# Patient Record
Sex: Female | Born: 1975 | Race: White | Hispanic: No | Marital: Married | Smoking: Never smoker
Health system: Southern US, Community
[De-identification: ages and names within clinical notes are randomized; demographics above are authoritative.]

## PROBLEM LIST (undated history)

## (undated) DIAGNOSIS — N979 Female infertility, unspecified: Secondary | ICD-10-CM

## (undated) HISTORY — DX: Female infertility, unspecified: N97.9

---

## 2000-08-09 ENCOUNTER — Other Ambulatory Visit: Admission: RE | Admit: 2000-08-09 | Discharge: 2000-08-09 | Payer: Self-pay | Admitting: Obstetrics and Gynecology

## 2000-09-05 ENCOUNTER — Encounter: Payer: Self-pay | Admitting: Obstetrics and Gynecology

## 2000-09-05 ENCOUNTER — Ambulatory Visit (HOSPITAL_COMMUNITY): Admission: RE | Admit: 2000-09-05 | Discharge: 2000-09-05 | Payer: Self-pay | Admitting: Obstetrics and Gynecology

## 2001-08-20 ENCOUNTER — Other Ambulatory Visit: Admission: RE | Admit: 2001-08-20 | Discharge: 2001-08-20 | Payer: Self-pay | Admitting: Gynecology

## 2002-03-19 HISTORY — PX: COLON SURGERY: SHX602

## 2002-10-20 ENCOUNTER — Encounter: Payer: Self-pay | Admitting: Obstetrics and Gynecology

## 2002-10-20 ENCOUNTER — Ambulatory Visit (HOSPITAL_COMMUNITY): Admission: RE | Admit: 2002-10-20 | Discharge: 2002-10-20 | Payer: Self-pay | Admitting: Obstetrics & Gynecology

## 2002-12-19 ENCOUNTER — Inpatient Hospital Stay (HOSPITAL_COMMUNITY): Admission: AD | Admit: 2002-12-19 | Discharge: 2002-12-25 | Payer: Self-pay | Admitting: Obstetrics and Gynecology

## 2002-12-19 ENCOUNTER — Encounter: Payer: Self-pay | Admitting: Obstetrics and Gynecology

## 2004-04-13 ENCOUNTER — Other Ambulatory Visit: Admission: RE | Admit: 2004-04-13 | Discharge: 2004-04-13 | Payer: Self-pay | Admitting: Obstetrics and Gynecology

## 2004-10-12 ENCOUNTER — Other Ambulatory Visit: Admission: RE | Admit: 2004-10-12 | Discharge: 2004-10-12 | Payer: Self-pay | Admitting: Obstetrics and Gynecology

## 2010-07-21 ENCOUNTER — Other Ambulatory Visit: Payer: Self-pay | Admitting: Obstetrics and Gynecology

## 2010-11-23 ENCOUNTER — Other Ambulatory Visit: Payer: Self-pay | Admitting: Obstetrics and Gynecology

## 2012-12-08 ENCOUNTER — Encounter: Payer: Self-pay | Admitting: Obstetrics and Gynecology

## 2012-12-08 ENCOUNTER — Ambulatory Visit (INDEPENDENT_AMBULATORY_CARE_PROVIDER_SITE_OTHER): Payer: BC Managed Care – PPO | Admitting: Obstetrics and Gynecology

## 2012-12-08 VITALS — BP 130/76 | HR 76 | Ht 65.0 in | Wt 188.0 lb

## 2012-12-08 DIAGNOSIS — Z01419 Encounter for gynecological examination (general) (routine) without abnormal findings: Secondary | ICD-10-CM

## 2012-12-08 DIAGNOSIS — Z0000 Encounter for general adult medical examination without abnormal findings: Secondary | ICD-10-CM

## 2012-12-08 DIAGNOSIS — N912 Amenorrhea, unspecified: Secondary | ICD-10-CM

## 2012-12-08 LAB — POCT URINALYSIS DIPSTICK
Bilirubin, UA: NEGATIVE
Glucose, UA: NEGATIVE
Ketones, UA: NEGATIVE
Nitrite, UA: NEGATIVE
Protein, UA: NEGATIVE
Urobilinogen, UA: NEGATIVE
pH, UA: 5

## 2012-12-08 LAB — CBC
MCH: 29.8 pg (ref 26.0–34.0)
MCHC: 33.8 g/dL (ref 30.0–36.0)
Platelets: 389 10*3/uL (ref 150–400)
RBC: 4.53 MIL/uL (ref 3.87–5.11)
WBC: 10.7 10*3/uL — ABNORMAL HIGH (ref 4.0–10.5)

## 2012-12-08 LAB — POCT URINE PREGNANCY: Preg Test, Ur: NEGATIVE

## 2012-12-08 NOTE — Progress Notes (Signed)
Patient ID: Desiree Melendez, female   DOB: 07-12-1975, 37 y.o.   MRN: [id number] GYNECOLOGY VISIT  PCP: None  Referring provider:   HPI: 37 y.o.   Married  Caucasian  female   G2P0202 with Patient's last menstrual period was 09/17/2012.   here for   AEX. Negative UPT. Felt crampy this weekend but menses did not start.   Hgb:  13.7 Urine:  1+WBC's, 1+ RBC's (asymptomatic). UPT - negative  GYNECOLOGIC HISTORY: Patient's last menstrual period was 09/17/2012. Sexually active:  yes Partner preference: female Contraception:  none  Menopausal hormone therapy: no DES exposure:   no Blood transfusions:  no  Sexually transmitted diseases:  no  GYN Procedures:  IVP treatment for infertility Mammogram:  n/a               Pap: 2012 wnl History of abnormal pap smear:  no   OB History   Grav Para Term Preterm Abortions TAB SAB Ect Mult Living   2 2  2     1 2        LIFESTYLE: Exercise:   Tennis, walking            Tobacco:    no Alcohol:       no Drug use:    no  OTHER HEALTH MAINTENANCE: Tetanus/TDap:  Up to date through school Gardisil:  NA Influenza:  11/2011 Zostavax:  NA  Bone density:n/a Colonoscopy:n/a  Cholesterol check: never  Family History  Problem Relation Age of Onset  . Osteoarthritis Mother   . Hypertension Mother   . Asthma Father   . Thyroid disease Sister   . Cancer Maternal Grandmother   . Hypertension Maternal Grandmother   . Stroke Maternal Grandmother     There are no active problems to display for this patient.  Past Medical History  Diagnosis Date  . Infertility, female     Past Surgical History  Procedure Laterality Date  . Colon surgery  2004    ALLERGIES: Review of patient's allergies indicates no known allergies.  No current outpatient prescriptions on file.   No current facility-administered medications for this visit.     ROS:  Pertinent items are noted in HPI.  SOCIAL HISTORY:   Married.  Teacher.   PHYSICAL  EXAMINATION:    BP 130/76  Pulse 76  Ht 5\' 5"  (1.651 m)  Wt 188 lb (85.276 kg)  BMI 31.28 kg/m2  LMP 09/17/2012   Wt Readings from Last 3 Encounters:  12/08/12 188 lb (85.276 kg)     Ht Readings from Last 3 Encounters:  12/08/12 5\' 5"  (1.651 m)    General appearance: alert, cooperative and appears stated age Head: Normocephalic, without obvious abnormality, atraumatic Neck: no adenopathy, supple, symmetrical, trachea midline and thyroid not enlarged, symmetric, no tenderness/mass/nodules Lungs: clear to auscultation bilaterally Breasts: Inspection negative, No nipple retraction or dimpling, No nipple discharge or bleeding, No axillary or supraclavicular adenopathy, Normal to palpation without dominant masses Heart: regular rate and rhythm Abdomen: Pfannenstiel incision, soft, non-tender; no masses,  no organomegaly Extremities: extremities normal, atraumatic, no cyanosis or edema Skin: Skin color, texture, turgor normal. No rashes or lesions Lymph nodes: Cervical, supraclavicular, and axillary nodes normal. No abnormal inguinal nodes palpated Neurologic: Grossly normal  Pelvic: External genitalia:  no lesions              Urethra:  normal appearing urethra with no masses, tenderness or lesions  Bartholins and Skenes: normal                 Vagina: normal appearing vagina with normal color and discharge, no lesions              Cervix: normal appearance              Pap and high risk HPV testing done: yes.            Bimanual Exam:  Uterus:  uterus is normal size, shape, consistency and nontender                                      Adnexa: normal adnexa in size, nontender and no masses                                      Rectovaginal: Confirms                                      Anus:  normal sphincter tone, no lesions  ASSESSMENT  Oligomenorrhea. Possible UTI.  PLAN  Mammogram Pap smear and high risk HPV testing Lipid profile, CMP, CBC. TSH, prolactin,  LH, FSH, estradiol. Urine culture.  Anticipate a course of Provera therapy.  Will await results of blood work.    Return annually or prn   An After Visit Summary was printed and given to the patient.

## 2012-12-08 NOTE — Patient Instructions (Signed)
EXERCISE AND DIET:  We recommended that you start or continue a regular exercise program for good health. Regular exercise means any activity that makes your heart beat faster and makes you sweat.  We recommend exercising at least 30 minutes per day at least 3 days a week, preferably 4 or 5.  We also recommend a diet low in fat and sugar.  Inactivity, poor dietary choices and obesity can cause diabetes, heart attack, stroke, and kidney damage, among others.    ALCOHOL AND SMOKING:  Women should limit their alcohol intake to no more than 7 drinks/beers/glasses of wine (combined, not each!) per week. Moderation of alcohol intake to this level decreases your risk of breast cancer and liver damage. And of course, no recreational drugs are part of a healthy lifestyle.  And absolutely no smoking or even second hand smoke. Most people know smoking can cause heart and lung diseases, but did you know it also contributes to weakening of your bones? Aging of your skin?  Yellowing of your teeth and nails?  CALCIUM AND VITAMIN D:  Adequate intake of calcium and Vitamin D are recommended.  The recommendations for exact amounts of these supplements seem to change often, but generally speaking 600 mg of calcium (either carbonate or citrate) and 800 units of Vitamin D per day seems prudent. Certain women may benefit from higher intake of Vitamin D.  If you are among these women, your doctor will have told you during your visit.    PAP SMEARS:  Pap smears, to check for cervical cancer or precancers,  have traditionally been done yearly, although recent scientific advances have shown that most women can have pap smears less often.  However, every woman still should have a physical exam from her gynecologist every year. It will include a breast check, inspection of the vulva and vagina to check for abnormal growths or skin changes, a visual exam of the cervix, and then an exam to evaluate the size and shape of the uterus and  ovaries.  And after 38 years of age, a rectal exam is indicated to check for rectal cancers. We will also provide age appropriate advice regarding health maintenance, like when you should have certain vaccines, screening for sexually transmitted diseases, bone density testing, colonoscopy, mammograms, etc.   MAMMOGRAMS:  All women over 40 years old should have a yearly mammogram. Many facilities now offer a "3D" mammogram, which may cost around $50 extra out of pocket. If possible,  we recommend you accept the option to have the 3D mammogram performed.  It both reduces the number of women who will be called back for extra views which then turn out to be normal, and it is better than the routine mammogram at detecting truly abnormal areas.    COLONOSCOPY:  Colonoscopy to screen for colon cancer is recommended for all women at age 50.  We know, you hate the idea of the prep.  We agree, BUT, having colon cancer and not knowing it is worse!!  Colon cancer so often starts as a polyp that can be seen and removed at colonscopy, which can quite literally save your life!  And if your first colonoscopy is normal and you have no family history of colon cancer, most women don't have to have it again for 10 years.  Once every ten years, you can do something that may end up saving your life, right?  We will be happy to help you get it scheduled when you are ready.    Be sure to check your insurance coverage so you understand how much it will cost.  It may be covered as a preventative service at no cost, but you should check your particular policy.    Secondary Amenorrhea  Secondary amenorrhea is the stopping of menstrual flow for 3 to 6 months in a female who has previously had periods. There are many possible causes. Most of these causes are not serious. Usually treating the underlying problem causing the loss of menses will return your periods to normal. CAUSES  Some common and uncommon causes of not menstruating  include:  Malnutrition.  Low blood sugar (hypoglycemia).  Polycystic ovarian disease.  Stress or fear.  Breastfeeding.  Hormone imbalance.  Ovarian failure.  Medications.  Extreme obesity.  Cystic fibrosis.  Low body weight or drastic weight reduction from any cause.  Early menopause.  Removal of ovaries or uterus.  Contraceptives.  Illness.  Long term (chronic) illnesses.  Cushing's syndrome.  Thyroid problems.  Birth control pills, patches, or vaginal rings for birth control. DIAGNOSIS  This diagnosis is made by your caregiver taking a medical history and doing a physical exam. Pregnancy must be ruled out. Often times, numerous blood tests of different hormones in the body may be measured. Urine testing may be done. Specialized x-rays may have to be done as well as measuring the body mass index (BMI). TREATMENT  Treatment depends on the cause of the amenorrhea. If an eating disorder is present, this can be treated with an adequate diet and therapy. Chronic illnesses may improve with treatment of the illness. Overall, the outlook is good. The amenorrhea may be corrected with medications, lifestyle changes, or surgery. If the amenorrhea cannot be corrected, it is sometimes possible to create a false menstruation with medications. Document Released: 04/16/2006 Document Revised: 05/28/2011 Document Reviewed: 02/21/2007 [facility] Patient Information 2014 [place], [place].

## 2012-12-09 LAB — LIPID PANEL
Cholesterol: 142 mg/dL (ref 0–200)
VLDL: 13 mg/dL (ref 0–40)

## 2012-12-09 LAB — THYROID PANEL WITH TSH
Free Thyroxine Index: 3.3 (ref 1.0–3.9)
T3 Uptake: 38 % — ABNORMAL HIGH (ref 22.5–37.0)

## 2012-12-09 LAB — HEMOGLOBIN, FINGERSTICK: Hemoglobin, fingerstick: 13.7 g/dL (ref 12.0–16.0)

## 2012-12-09 LAB — COMPREHENSIVE METABOLIC PANEL
ALT: 9 U/L (ref 0–35)
AST: 15 U/L (ref 0–37)
CO2: 26 mEq/L (ref 19–32)
Chloride: 105 mEq/L (ref 96–112)
Creat: 0.86 mg/dL (ref 0.50–1.10)
Sodium: 139 mEq/L (ref 135–145)
Total Bilirubin: 0.4 mg/dL (ref 0.3–1.2)
Total Protein: 7.5 g/dL (ref 6.0–8.3)

## 2012-12-09 LAB — FSH/LH: FSH: 9.1 m[IU]/mL

## 2012-12-09 LAB — PROLACTIN: Prolactin: 32.9 ng/mL

## 2012-12-10 ENCOUNTER — Other Ambulatory Visit: Payer: Self-pay | Admitting: Obstetrics and Gynecology

## 2012-12-10 ENCOUNTER — Telehealth: Payer: Self-pay | Admitting: Obstetrics and Gynecology

## 2012-12-10 DIAGNOSIS — N912 Amenorrhea, unspecified: Secondary | ICD-10-CM

## 2012-12-10 MED ORDER — MEDROXYPROGESTERONE ACETATE 10 MG PO TABS: 10.0000 mg | ORAL_TABLET | Freq: Every day | ORAL | Status: DC

## 2012-12-10 NOTE — Telephone Encounter (Signed)
Unable to reach the patient by phone.  Connection lost.  I left a message that I will release results in My Chart. Patient will need a follow up prolactin in one month and will need to take an Rx for Provera for 10 days.

## 2012-12-11 ENCOUNTER — Encounter: Payer: Self-pay | Admitting: Obstetrics and Gynecology

## 2012-12-11 LAB — IPS PAP TEST WITH HPV

## 2012-12-17 ENCOUNTER — Telehealth: Payer: Self-pay

## 2012-12-17 NOTE — Telephone Encounter (Signed)
Dr. Edward Jolly, in reviewing chart, I noticed a urine culture was never sent on patient for office visit 12-08-12.  Patient was asymptomatic.  Please Advise.

## 2012-12-17 NOTE — Telephone Encounter (Signed)
Please contact patient and have her come for a lab visit only for urine dip and if positive urinalysis and culture.    Thanks.

## 2012-12-17 NOTE — Telephone Encounter (Signed)
LMOVM to call.  Pt. Seen 9-22 and had 1+WBC's  And 1+RBC's on urine dip but a urine culture was not sent.  Dr. Edward Jolly would Like patient to come by office for urine dip and if positive a urinalysis and culture.

## 2012-12-18 NOTE — Telephone Encounter (Signed)
Spoke with patient, appt scheduled for U/A check 12/19/12 if Positive for RBC's or WBC's send for Culture Per Dr. Edward Jolly.

## 2012-12-19 ENCOUNTER — Ambulatory Visit (INDEPENDENT_AMBULATORY_CARE_PROVIDER_SITE_OTHER): Payer: BC Managed Care – PPO | Admitting: Obstetrics and Gynecology

## 2012-12-19 VITALS — Ht 65.0 in

## 2012-12-19 DIAGNOSIS — R319 Hematuria, unspecified: Secondary | ICD-10-CM

## 2012-12-19 DIAGNOSIS — R8281 Pyuria: Secondary | ICD-10-CM

## 2012-12-19 DIAGNOSIS — R82998 Other abnormal findings in urine: Secondary | ICD-10-CM

## 2012-12-19 NOTE — Progress Notes (Signed)
Patient is on cycle Per Dr. Edward Jolly she prefers her not to be on cycle as far as doing urinalysis. Patient decided that she will come back next week.

## 2012-12-20 NOTE — Progress Notes (Signed)
Patient previously presented for annual exam and had some RBCs and WBCs noted and was not symptomatic. Was expected her menses which came some days later. Urine culture was not sent at the occasion so the patient was asked to return to give another specimen. Patient is still on her menstrual cycle, so was asked to return.  Patient seen by nurse and is agreeable to the plan.

## 2012-12-23 ENCOUNTER — Other Ambulatory Visit: Payer: BC Managed Care – PPO

## 2012-12-24 ENCOUNTER — Other Ambulatory Visit: Payer: BC Managed Care – PPO

## 2013-01-22 ENCOUNTER — Other Ambulatory Visit: Payer: Self-pay

## 2013-12-18 ENCOUNTER — Telehealth: Payer: Self-pay

## 2013-12-18 NOTE — Telephone Encounter (Signed)
Spoke with patient. Patient is agreeable to schedule appointment at this time. Offered Oct 7th at 11am but patient declines due to meeting at work. Appointment scheduled for Oct 21st at 3pm with Dr.Silva. Patient is agreeable to date and time.  Routing to provider for final review. Patient agreeable to disposition. Will close encounter

## 2013-12-18 NOTE — Telephone Encounter (Signed)
Message copied by Jasmine Awe on Fri Dec 18, 2013 10:42 AM ------      Message from: [place], BROOK E      Created: Fri Dec 18, 2013  6:35 AM      Regarding: Please schedule AEX and labs       Please contact patient to have her come in for her annual exam and repeat prolactin level.       It was slightly elevated at her last year's annual exam, and she has not returned for a recheck.       I received a reminder message for labs past due.             Thanks!            Josefa Half      ----- Message -----         From: SYSTEM         Sent: 12/15/2013  12:01 AM           To: Brook E Amundson de Berton Lan, MD                   ------

## 2014-01-01 ENCOUNTER — Other Ambulatory Visit: Payer: Self-pay

## 2014-01-06 ENCOUNTER — Ambulatory Visit: Payer: BC Managed Care – PPO | Admitting: Obstetrics and Gynecology

## 2014-01-18 ENCOUNTER — Encounter: Payer: Self-pay | Admitting: Obstetrics and Gynecology

## 2014-02-10 ENCOUNTER — Ambulatory Visit (INDEPENDENT_AMBULATORY_CARE_PROVIDER_SITE_OTHER): Payer: BC Managed Care – PPO | Admitting: Certified Nurse Midwife

## 2014-02-10 ENCOUNTER — Encounter: Payer: Self-pay | Admitting: Certified Nurse Midwife

## 2014-02-10 VITALS — BP 110/64 | HR 64 | Resp 16 | Ht 65.25 in | Wt 190.0 lb

## 2014-02-10 DIAGNOSIS — R899 Unspecified abnormal finding in specimens from other organs, systems and tissues: Secondary | ICD-10-CM

## 2014-02-10 DIAGNOSIS — Z01419 Encounter for gynecological examination (general) (routine) without abnormal findings: Secondary | ICD-10-CM

## 2014-02-10 DIAGNOSIS — N912 Amenorrhea, unspecified: Secondary | ICD-10-CM

## 2014-02-10 NOTE — Patient Instructions (Signed)

## 2014-02-10 NOTE — Progress Notes (Signed)
38 y.o. G2P0202 Married Caucasian Fe here for annual exam. Periods normal, no issues. Contraception condoms, working well. Sees [facility] if needed. No health issues today. Due for Prolactin repeat due to slight elevation.  Patient's last menstrual period was 01/02/2014.          Sexually active: Yes.    The current method of family planning is condoms sometimes.    Exercising: Yes.    cardio Smoker:  no  Health Maintenance: Pap:  12-08-12 neg HPV HR neg, endo cells noted MMG:  none Colonoscopy:  none BMD:   none TDaP:  2005 Labs: Upt-neg Self breast exam: done monthly   reports that she has never smoked. She does not have any smokeless tobacco history on file. She reports that she does not drink alcohol or use illicit drugs.  Past Medical History  Diagnosis Date  . Infertility, female     Past Surgical History  Procedure Laterality Date  . Colon surgery  2004    No current outpatient prescriptions on file.   No current facility-administered medications for this visit.    Family History  Problem Relation Age of Onset  . Osteoarthritis Mother   . Hypertension Mother   . Asthma Father   . Thyroid disease Sister   . Cancer Maternal Grandmother   . Hypertension Maternal Grandmother   . Stroke Maternal Grandmother     ROS:  Pertinent items are noted in HPI.  Otherwise, a comprehensive ROS was negative.  Exam:   BP 110/64 mmHg  Pulse 64  Resp 16  Ht 5' 5.25" (1.657 m)  Wt 190 lb (86.183 kg)  BMI 31.39 kg/m2  LMP 01/02/2014 Height: 5' 5.25" (165.7 cm)  Ht Readings from Last 3 Encounters:  02/10/14 5' 5.25" (1.657 m)  12/19/12 5\' 5"  (1.651 m)  12/08/12 5\' 5"  (1.651 m)    General appearance: alert, cooperative and appears stated age Head: Normocephalic, without obvious abnormality, atraumatic Neck: no adenopathy, supple, symmetrical, trachea midline and thyroid normal to inspection and palpation and non-palpable Lungs: clear to auscultation  bilaterally Breasts: normal appearance, no masses or tenderness, Inspection negative, No nipple retraction or dimpling Heart: regular rate and rhythm Abdomen: soft, non-tender; no masses,  no organomegaly Extremities: extremities normal, atraumatic, no cyanosis or edema Skin: Skin color, texture, turgor normal. No rashes or lesions Lymph nodes: Cervical, supraclavicular, and axillary nodes normal. No abnormal inguinal nodes palpated Neurologic: Grossly normal   Pelvic: External genitalia:  no lesions              Urethra:  normal appearing urethra with no masses, tenderness or lesions              Bartholin's and Skene's: normal                 Vagina: normal appearing vagina with normal color and discharge, no lesions              Cervix: normal, non tender, no masses              Pap taken: No. Bimanual Exam:  Uterus:  normal size, contour, position, consistency, mobility, non-tender and mid position              Adnexa: normal adnexa and no mass, fullness, tenderness               Rectovaginal: Confirms               Anus:  normal sphincter tone,  no lesions  A:  Well Woman with normal exam  Contraception condoms  Previous elevated Prolactin level, for repeat today  P:   Reviewed health and wellness pertinent to exam  Lab: Prolactin  Pap smear not taken today   counseled on breast self exam, adequate intake of calcium and vitamin D, diet and exercise  return annually or prn  An After Visit Summary was printed and given to the patient.

## 2014-02-11 LAB — PROLACTIN: Prolactin: 11.4 ng/mL

## 2014-02-13 NOTE — Progress Notes (Signed)
Reviewed personally.  M. Suzanne Ieesha Abbasi, MD.  

## 2015-02-14 ENCOUNTER — Telehealth: Payer: Self-pay | Admitting: Certified Nurse Midwife

## 2015-02-14 NOTE — Telephone Encounter (Signed)
Left message regarding upcoming appointment has been canceled and needs to be rescheduled. °

## 2015-02-17 ENCOUNTER — Ambulatory Visit: Payer: BC Managed Care – PPO | Admitting: Certified Nurse Midwife

## 2015-03-09 ENCOUNTER — Ambulatory Visit: Payer: BC Managed Care – PPO | Admitting: Certified Nurse Midwife

## 2015-03-24 ENCOUNTER — Ambulatory Visit: Payer: BC Managed Care – PPO | Admitting: Certified Nurse Midwife

## 2018-09-29 ENCOUNTER — Other Ambulatory Visit: Payer: Self-pay

## 2018-10-01 ENCOUNTER — Encounter: Payer: Self-pay | Admitting: Obstetrics and Gynecology

## 2018-10-01 ENCOUNTER — Other Ambulatory Visit: Payer: Self-pay

## 2018-10-01 ENCOUNTER — Other Ambulatory Visit (HOSPITAL_COMMUNITY)
Admission: RE | Admit: 2018-10-01 | Discharge: 2018-10-01 | Disposition: A | Discharge status: Home / Self Care | Payer: BC Managed Care – PPO | Source: Ambulatory Visit | Attending: Obstetrics and Gynecology | Admitting: Obstetrics and Gynecology

## 2018-10-01 ENCOUNTER — Ambulatory Visit: Payer: BC Managed Care – PPO | Admitting: Obstetrics and Gynecology

## 2018-10-01 VITALS — BP 122/80 | HR 72 | Temp 97.4°F | Resp 12 | Ht 65.5 in | Wt 194.0 lb

## 2018-10-01 DIAGNOSIS — Z01419 Encounter for gynecological examination (general) (routine) without abnormal findings: Secondary | ICD-10-CM

## 2018-10-01 DIAGNOSIS — Z23 Encounter for immunization: Secondary | ICD-10-CM | POA: Diagnosis not present

## 2018-10-01 DIAGNOSIS — E229 Hyperfunction of pituitary gland, unspecified: Secondary | ICD-10-CM

## 2018-10-01 DIAGNOSIS — R7989 Other specified abnormal findings of blood chemistry: Secondary | ICD-10-CM

## 2018-10-01 NOTE — Patient Instructions (Signed)

## 2018-10-01 NOTE — Progress Notes (Signed)
43 y.o. G7P0002 Married Caucasian female here as an old-new for an annual exam. Patient states that she has had two yeast infections in one month.     4 menses in the last 2 years.  Nov 2018, May 2019, Nov 2019, late April 2020.  States she had irregular cycles prior to this. Denies hot flashes.  Told her FSH was low, about 12.3.   She is not certain about her estrogen level.  She had low testosterone and is doing pellets to raise her level. States her thyroid level was normal.  States the remainder of there labs were normal.   Did labs through [facility].   Patient is a Pharmacist, hospital.  Children are 69 yo.  PCP: No PCP - patient however has been seen at [facility] for weight loss and has had blood work    Patient's last menstrual period was 07/08/2018 (within days).     Period Cycle (Days): (every 6 months) Period Duration (Days): 3-6 Period Pattern: (!) Irregular Menstrual Flow: Moderate Menstrual Control: Maxi pad Menstrual Control Change Freq (Hours): 3-4 Dysmenorrhea: None     Sexually active: Yes.    The current method of family planning is none.    Exercising: Yes.    cycling and walking Smoker:  no  Health Maintenance: Pap:  12/08/12 Neg:Neg HR HPV History of abnormal Pap:  no MMG:  never TDaP:  2010 -- patient is due Gardasil:   no HIV: negative in pregnancy Screening Labs: had labs done recently with [facility]   reports that she has never smoked. She has never used smokeless tobacco. She reports that she does not drink alcohol or use drugs.  Past Medical History:  Diagnosis Date  . Infertility, female     Past Surgical History:  Procedure Laterality Date  . CESAREAN SECTION      No current outpatient medications on file.   No current facility-administered medications for this visit.     Family History  Problem Relation Age of Onset  . Osteoarthritis Mother   . Hypertension Mother   . Asthma Father   . Thyroid disease Sister   . Cancer  Maternal Grandmother   . Hypertension Maternal Grandmother   . Stroke Maternal Grandmother     Review of Systems  Constitutional: Negative.   HENT: Negative.   Eyes: Negative.   Respiratory: Negative.   Cardiovascular: Negative.   Gastrointestinal: Negative.   Endocrine: Negative.   Genitourinary: Negative.   Musculoskeletal: Negative.   Skin: Negative.   Allergic/Immunologic: Negative.   Neurological: Negative.   Hematological: Negative.   Psychiatric/Behavioral: Negative.     Exam:   BP 122/80 (BP Location: Right Arm, Patient Position: Sitting, Cuff Size: Normal)   Pulse 72   Temp (!) 97.4 F (36.3 C) (Temporal)   Resp 12   Ht 5' 5.5" (1.664 m)   Wt 194 lb (88 kg)   LMP 07/08/2018 (Within Days)   BMI 31.79 kg/m     General appearance: alert, cooperative and appears stated age Head: normocephalic, without obvious abnormality, atraumatic Neck: no adenopathy, supple, symmetrical, trachea midline and thyroid normal to inspection and palpation Lungs: clear to auscultation bilaterally Breasts: normal appearance, no masses or tenderness, No nipple retraction or dimpling, No nipple discharge or bleeding, No axillary adenopathy Heart: regular rate and rhythm Abdomen: soft, non-tender; no masses, no organomegaly Extremities: extremities normal, atraumatic, no cyanosis or edema Skin: skin color, texture, turgor normal. No rashes or lesions Lymph nodes: cervical, supraclavicular,  and axillary nodes normal. Neurologic: grossly normal  Pelvic: External genitalia:  no lesions              No abnormal inguinal nodes palpated.              Urethra:  normal appearing urethra with no masses, tenderness or lesions              Bartholins and Skenes: normal                 Vagina: normal appearing vagina with normal color and discharge, no lesions              Cervix: no lesions.  Tiny streak of blood.               Pap taken: Yes.   Bimanual Exam:  Uterus:  normal size, contour,  position, consistency, mobility, non-tender              Adnexa: no mass, fullness, tenderness              Rectal exam: Yes.  .  Confirms.              Anus:  normal sphincter tone, no lesions  Chaperone was present for exam.  Assessment:   Well woman visit with normal exam. Hx elevated prolactin.  Perimenopausal female.   Plan: Mammogram screening discussed. Self breast awareness reviewed. Pap and HR HPV as above. Guidelines for Calcium, Vitamin D, regular exercise program including cardiovascular and weight bearing exercise. Declines contraception.  TDap. Gardasil.  Will get a copy of her hormone testing.  Check prolactin today.  Follow up annually and prn.   After visit summary provided.

## 2018-10-02 LAB — PROLACTIN: Prolactin: 13.9 ng/mL (ref 4.8–23.3)

## 2018-10-06 LAB — CYTOLOGY - PAP
Diagnosis: NEGATIVE
HPV: NOT DETECTED

## 2018-10-13 ENCOUNTER — Telehealth: Payer: Self-pay | Admitting: Obstetrics and Gynecology

## 2018-10-13 NOTE — Telephone Encounter (Signed)
Spoke with patient, advised per Dr. Quincy Simmonds. Patient declines OV at this time, will be traveling out of town 7/28. Patient states she will try OTC monistat, will return call to office for OV if symptoms do not resolve.   Routing to provider for final review. Patient is agreeable to disposition. Will close encounter.

## 2018-10-13 NOTE — Telephone Encounter (Signed)
Patient left voicemail over lunch stating that she is experiencing her 3rd yeast infection in the last 2 months. Patient is leaving for the beach tomorrow and would like to have a prescription on hand.

## 2018-10-13 NOTE — Telephone Encounter (Signed)
I do recommend an office visit with me.  How about 12:45 tomorrow afternoon?

## 2018-10-13 NOTE — Telephone Encounter (Signed)
Spoke with patient. Patient reports vaginal itching that started on 10/12/18. States this is her 3rd yeast infection in 2 months and she will be leaving for the beach tomorrow, does not want to use OTC monistat this time. Has used monistat the past 2 times and was effective. Denies burning, vaginal d/c or urinary symptoms. Advised patient OV needed for further evaluation, patient declined. States she discussed with Dr. Quincy Simmonds at [facility] and was advised to call if occurs again. Advised I will forward to Dr. Quincy Simmonds to review. Pharmacy on file confirmed.   Dr. Quincy Simmonds -please review and advise on RX.

## 2018-10-24 ENCOUNTER — Other Ambulatory Visit: Payer: Self-pay | Admitting: Obstetrics and Gynecology

## 2018-10-24 DIAGNOSIS — Z1231 Encounter for screening mammogram for malignant neoplasm of breast: Secondary | ICD-10-CM

## 2018-11-17 ENCOUNTER — Other Ambulatory Visit: Payer: Self-pay

## 2018-11-17 ENCOUNTER — Ambulatory Visit
Admission: RE | Admit: 2018-11-17 | Discharge: 2018-11-17 | Disposition: A | Discharge status: Home / Self Care | Payer: BC Managed Care – PPO | Source: Ambulatory Visit

## 2018-11-17 DIAGNOSIS — Z1231 Encounter for screening mammogram for malignant neoplasm of breast: Secondary | ICD-10-CM

## 2019-09-30 NOTE — Progress Notes (Signed)
44 y.o. G77P0002 Married Caucasian female here for annual exam.    No menstruation for over one year.  Doing testosterone pellets every several months.  No hot flashes.  Levels are monitored by [facility].  [place] 58.9 on 08/14/19.  Completed Covid vaccine in April.  PCP: None  Patient's last menstrual period was 07/08/2018 (approximate).     Period Cycle (Days):  (no cycle in one year)     Sexually active: Yes.    The current method of family planning is none.    Exercising: Yes.    rowing, pelloton, and pickle ball Smoker:  no  Health Maintenance: Pap:10-01-18 Neg:Neg HR HPV, 12/08/12 Neg:Neg HR HPV History of abnormal Pap:  no MMG: 11-17-18 3D/Neg/density B/Birads1 Colonoscopy:  n/a BMD:   n/a  Result  n/a TDaP:  10-01-18 Gardasil:   Pt. Had 1 of 3 on 10-01-18 HIV:Neg in preg Hep C:never Screening Labs:  [facility].    reports that she has never smoked. She has never used smokeless tobacco. She reports that she does not drink alcohol and does not use drugs.  Past Medical History:  Diagnosis Date  . Infertility, female     Past Surgical History:  Procedure Laterality Date  . CESAREAN SECTION      No current outpatient medications on file.   No current facility-administered medications for this visit.    Family History  Problem Relation Age of Onset  . Osteoarthritis Mother   . Hypertension Mother   . Asthma Father   . Thyroid disease Sister   . Cancer Maternal Grandmother   . Hypertension Maternal Grandmother   . Stroke Maternal Grandmother     Review of Systems  All other systems reviewed and are negative.   Exam:   BP 116/78 (Cuff Size: Large)   Pulse 64   Resp 14   Ht 5' 5.25" (1.657 m)   Wt 195 lb (88.5 kg)   LMP 07/08/2018 (Approximate)   BMI 32.20 kg/m     General appearance: alert, cooperative and appears stated age Head: normocephalic, without obvious abnormality, atraumatic Neck: no adenopathy, supple, symmetrical, trachea midline and thyroid  normal to inspection and palpation Lungs: clear to auscultation bilaterally Breasts: normal appearance, no masses or tenderness, No nipple retraction or dimpling, No nipple discharge or bleeding, No axillary adenopathy Heart: regular rate and rhythm Abdomen: soft, non-tender; no masses, no organomegaly Extremities: extremities normal, atraumatic, no cyanosis or edema Skin: skin color, texture, turgor normal. No rashes or lesions Lymph nodes: cervical, supraclavicular, and axillary nodes normal. Neurologic: grossly normal  Pelvic: External genitalia:  Right lateral vulva with 9 mm speckled pigmented nevus.               No abnormal inguinal nodes palpated.              Urethra:  normal appearing urethra with no masses, tenderness or lesions              Bartholins and Skenes: normal                 Vagina: normal appearing vagina with normal color and discharge, no lesions              Cervix: no lesions              Pap taken: No. Bimanual Exam:  Uterus:  normal size, contour, position, consistency, mobility, non-tender              Adnexa: no  mass, fullness, tenderness              Rectal exam: Yes.  .  Confirms.              Anus:  normal sphincter tone, no lesions  Chaperone was present for exam.  Assessment:   Well woman visit with normal exam. Menopausal female.  Testosterone therapy. Melanocytic nevus of the right vulva.  Plan: Mammogram screening discussed. Self breast awareness reviewed. Pap and HR HPV 2025.  Guidelines for Calcium, Vitamin D, regular exercise program including cardiovascular and weight bearing exercise. Will complete Gardasil series.  She states she is willing to pay for the third vaccine if it is not covered by her insurance company.  Return for vulvar biopsy.  Procedure reviewed.  Brochure on menopause to patient.  Follow up annually and prn.   After visit summary provided.

## 2019-10-05 ENCOUNTER — Encounter: Payer: Self-pay | Admitting: Obstetrics and Gynecology

## 2019-10-05 ENCOUNTER — Ambulatory Visit: Payer: BC Managed Care – PPO | Admitting: Obstetrics and Gynecology

## 2019-10-05 ENCOUNTER — Other Ambulatory Visit: Payer: Self-pay

## 2019-10-05 VITALS — BP 116/78 | HR 64 | Resp 14 | Ht 65.25 in | Wt 195.0 lb

## 2019-10-05 DIAGNOSIS — Z23 Encounter for immunization: Secondary | ICD-10-CM | POA: Diagnosis not present

## 2019-10-05 DIAGNOSIS — Z01419 Encounter for gynecological examination (general) (routine) without abnormal findings: Secondary | ICD-10-CM | POA: Diagnosis not present

## 2019-10-05 DIAGNOSIS — D229 Melanocytic nevi, unspecified: Secondary | ICD-10-CM

## 2019-10-05 NOTE — Patient Instructions (Signed)

## 2019-10-13 ENCOUNTER — Telehealth: Payer: Self-pay | Admitting: Obstetrics and Gynecology

## 2019-10-13 NOTE — Telephone Encounter (Signed)
Call placed to convey benefits. Spoke with the patient and conveyed the benefits. Patient understands/agreeable with the benefits. Patient is aware of the cancellation policy. Appointment scheduled 10/19/19@9 :30am with Dr. Quincy Simmonds

## 2019-10-14 NOTE — Progress Notes (Signed)
GYNECOLOGY  VISIT   HPI: 44 y.o.   Married  Caucasian  female   G1P0002 with Patient's last menstrual period was 06/18/2018.   here for vulvar biopsy.   GYNECOLOGIC HISTORY: Patient's last menstrual period was 06/18/2018. Contraception: None Menopausal hormone therapy: none Last mammogram: 11-17-18 3D/Neg/density B/Birads1 Last pap smear::10-01-18 Neg:Neg HR HPV, 12/08/12 Neg:Neg HR HPV         OB History    Gravida  1   Para  0   Term      Preterm  0   AB      Living  2     SAB      TAB      Ectopic      Multiple  1   Live Births  2              There are no problems to display for this patient.   Past Medical History:  Diagnosis Date  . Infertility, female     Past Surgical History:  Procedure Laterality Date  . CESAREAN SECTION      No current outpatient medications on file.   No current facility-administered medications for this visit.     ALLERGIES: Patient has no known allergies.  Family History  Problem Relation Age of Onset  . Osteoarthritis Mother   . Hypertension Mother   . Asthma Father   . Thyroid disease Sister   . Cancer Maternal Grandmother   . Hypertension Maternal Grandmother   . Stroke Maternal Grandmother     Social History   Socioeconomic History  . Marital status: Married    Spouse name: Not on file  . Number of children: Not on file  . Years of education: Not on file  . Highest education level: Not on file  Occupational History  . Not on file  Tobacco Use  . Smoking status: Never Smoker  . Smokeless tobacco: Never Used  Vaping Use  . Vaping Use: Never used  Substance and Sexual Activity  . Alcohol use: No    Alcohol/week: 0.0 standard drinks  . Drug use: No  . Sexual activity: Yes    Partners: Male    Birth control/protection: None  Other Topics Concern  . Not on file  Social History Narrative  . Not on file   Social Determinants of Health   Financial Resource Strain:   . Difficulty of Paying  Living Expenses:   Food Insecurity:   . Worried About Charity fundraiser in the Last Year:   . Arboriculturist in the Last Year:   Transportation Needs:   . Film/video editor (Medical):   Marland Kitchen Lack of Transportation (Non-Medical):   Physical Activity:   . Days of Exercise per Week:   . Minutes of Exercise per Session:   Stress:   . Feeling of Stress :   Social Connections:   . Frequency of Communication with Friends and Family:   . Frequency of Social Gatherings with Friends and Family:   . Attends Religious Services:   . Active Member of Clubs or Organizations:   . Attends Archivist Meetings:   Marland Kitchen Marital Status:   Intimate Partner Violence:   . Fear of Current or Ex-Partner:   . Emotionally Abused:   Marland Kitchen Physically Abused:   . Sexually Abused:     Review of Systems  All other systems reviewed and are negative.   PHYSICAL EXAMINATION:    BP 122/76  Pulse 80   Ht 5' 5.25" (1.657 m)   Wt 197 lb (89.4 kg)   LMP 06/18/2018   BMI 32.53 kg/m     General appearance: alert, cooperative and appears stated age   Skin:  7 mm spreading moderate brown nevus of right medial thigh near vulva.    Vulvar/thigh skin biopsy.  Consent for procedure.  Betadine prep.  Local 1% lidocaine, lot [id number], exp 02/17/20. 4 mm punch biopsy.  Tissue to pathology.  2 sutures of 3/0 Vicryl.  Minimal EBL. No complications.  Chaperone was present for exam.  ASSESSMENT  Melanocytic nevus of right medial thigh near vulva.   PLAN  FU biopsy.  If atypical or dysplastic cells, will need excision with a margin.  FU prn.

## 2019-10-19 ENCOUNTER — Encounter: Payer: Self-pay | Admitting: Obstetrics and Gynecology

## 2019-10-19 ENCOUNTER — Other Ambulatory Visit: Payer: Self-pay

## 2019-10-19 ENCOUNTER — Other Ambulatory Visit (HOSPITAL_COMMUNITY)
Admission: RE | Admit: 2019-10-19 | Discharge: 2019-10-19 | Disposition: A | Discharge status: Home / Self Care | Payer: BC Managed Care – PPO | Source: Ambulatory Visit | Attending: Obstetrics and Gynecology | Admitting: Obstetrics and Gynecology

## 2019-10-19 ENCOUNTER — Ambulatory Visit: Payer: BC Managed Care – PPO | Admitting: Obstetrics and Gynecology

## 2019-10-19 DIAGNOSIS — D229 Melanocytic nevi, unspecified: Secondary | ICD-10-CM | POA: Diagnosis not present

## 2019-10-19 NOTE — Patient Instructions (Signed)
Vulva Biopsy, Care After This sheet gives you information about how to care for yourself after your procedure. Your health care provider may also give you more specific instructions. If you have problems or questions, contact your health care provider. What can I expect after the procedure? After the procedure, it is common to have:  Slight bleeding from the biopsy site.  Discomfort at the biopsy site. Follow these instructions at home: Biopsy site care   Follow instructions from your health care provider about how to take care of your biopsy site. Make sure you: ? Clean the area using water and mild soap twice a day or as told by your health care provider. Gently pat the area dry. ? If you were prescribed an antibiotic ointment, apply it as told by your health care provider. Do not stop using the antibiotic even if your condition improves. ? Take a warm water bath (sitz bath) as needed to help with pain and discomfort. A sitz bath is taken while you are sitting down. The water should only come up to your hips and should cover your buttocks. ? Leave stitches (sutures), skin glue, or adhesive strips in place. These skin closures may need to stay in place for 2 weeks or longer. If adhesive strip edges start to loosen and curl up, you may trim the loose edges. Do not remove adhesive strips completely unless your health care provider tells you to do that.  Check your biopsy site every day for signs of infection. Check for: ? More redness, swelling, or pain. ? More fluid or blood. ? Warmth. ? Pus or a bad smell.  Do not rub the biopsy area after urinating. Gently pat the area dry or use a bottle filled with warm water (peri-bottle) to clean the area. Gently wipe from front to back. Lifestyle  Wear loose, cotton underwear. Do not wear tight pants.  Do not use a tampon, douche, or put anything inside your vagina for at least 1 week or until your health care provider approves.  Do not have sex  for at least 1 week or until your health care provider approves.  Do not exercise, such as running or biking, until your health care provider approves.  Do not swim or use a hot tub until your health care provider approves. You may shower or take a sitz bath. General instructions  Take over-the-counter and prescription medicines only as told by your health care provider.  Use a sanitary napkin until the bleeding stops.  Keep all follow-up visits as told by your health care provider. This is important. Contact a health care provider if:  You have more redness, swelling, or pain around your biopsy site.  You have more fluid or blood coming from your biopsy site.  Your biopsy site feels warm to the touch.  Your pain is not controlled with medicine. Get help right away if you have:  Heavy bleeding from the vulva.  Pus or a bad smell coming from your biopsy site.  A fever.  Lower abdominal pain. Summary  After the procedure, it is common to have slight bleeding and discomfort at the biopsy site.  Follow instructions from your health care provider after your biopsy. Make sure you clean the area with water and mild soap. Pat the area dry.  Take sitz baths as needed to help with pain and discomfort. Leave any sutures in place.  Check your biopsy site for signs of infection, which may include more redness, swelling, pain, fluid,  or blood, or feeling warm to the touch.  Get help right away if you have heavy bleeding, a fever, pus or a bad smell, or pain in the lower abdomen. This information is not intended to replace advice given to you by your health care provider. Make sure you discuss any questions you have with your health care provider. Document Revised: 09/05/2017 Document Reviewed: 09/05/2017 Elsevier Patient Education  2020 [employer]  

## 2019-10-21 LAB — SURGICAL PATHOLOGY

## 2019-10-26 ENCOUNTER — Telehealth: Payer: Self-pay

## 2019-10-26 DIAGNOSIS — D229 Melanocytic nevi, unspecified: Secondary | ICD-10-CM

## 2019-10-26 NOTE — Telephone Encounter (Signed)
-----   Message from Nunzio Cobbs, MD sent at 10/23/2019  9:42 AM EDT ----- Please contact patient with results of testing.  The biopsy does show some moderate atypia, and she will need to have re-excision of the area to get a clear margin around the abnormal area.  I can do this in the office. This will need a precert.

## 2019-10-26 NOTE — Telephone Encounter (Signed)
Spoke with pt. Pt given results and recommendations per Dr Quincy Simmonds.Pt agreeable and verbalized understanding. Pt scheduled for re- excision vuvlar bx on 8/13 at 1 pm with Dr Quincy Simmonds. Pt agreeable to date and time of appt. Pt aware of call for benefits.  Encounter closed.  Cc: Alfonse Spruce for precert. Orders placed.

## 2019-10-28 ENCOUNTER — Other Ambulatory Visit: Payer: Self-pay

## 2019-10-28 NOTE — Progress Notes (Signed)
Erroneous encounter

## 2019-10-29 NOTE — Progress Notes (Signed)
GYNECOLOGY  VISIT   HPI: 44 y.o.   Married  Caucasian  female   G1P0002 with Patient's last menstrual period was 06/17/2017 (within years).   here for vulvar excision.  She has a vulvar biopsy of a pigmented nevus, which showed moderate atypia.   She does not have a dermatologist.   GYNECOLOGIC HISTORY: Patient's last menstrual period was 06/17/2017 (within years). Contraception:  none Menopausal hormone therapy:  none Last mammogram: 11-17-18 3D/Neg/density B/Birads1 Last pap smear: :10-01-18 Neg:Neg HR HPV,12/08/12 Neg:Neg HR HPV         OB History    Gravida  1   Para  0   Term      Preterm  0   AB      Living  2     SAB      TAB      Ectopic      Multiple  1   Live Births  2              There are no problems to display for this patient.   Past Medical History:  Diagnosis Date  . Infertility, female     Past Surgical History:  Procedure Laterality Date  . CESAREAN SECTION      No current outpatient medications on file.   No current facility-administered medications for this visit.     ALLERGIES: Patient has no known allergies.  Family History  Problem Relation Age of Onset  . Osteoarthritis Mother   . Hypertension Mother   . Asthma Father   . Thyroid disease Sister   . Cancer Maternal Grandmother   . Hypertension Maternal Grandmother   . Stroke Maternal Grandmother     Social History   Socioeconomic History  . Marital status: Married    Spouse name: Not on file  . Number of children: Not on file  . Years of education: Not on file  . Highest education level: Not on file  Occupational History  . Not on file  Tobacco Use  . Smoking status: Never Smoker  . Smokeless tobacco: Never Used  Vaping Use  . Vaping Use: Never used  Substance and Sexual Activity  . Alcohol use: No    Alcohol/week: 0.0 standard drinks  . Drug use: No  . Sexual activity: Yes    Partners: Male    Birth control/protection: None  Other Topics Concern   . Not on file  Social History Narrative  . Not on file   Social Determinants of Health   Financial Resource Strain:   . Difficulty of Paying Living Expenses:   Food Insecurity:   . Worried About Charity fundraiser in the Last Year:   . Arboriculturist in the Last Year:   Transportation Needs:   . Film/video editor (Medical):   Marland Kitchen Lack of Transportation (Non-Medical):   Physical Activity:   . Days of Exercise per Week:   . Minutes of Exercise per Session:   Stress:   . Feeling of Stress :   Social Connections:   . Frequency of Communication with Friends and Family:   . Frequency of Social Gatherings with Friends and Family:   . Attends Religious Services:   . Active Member of Clubs or Organizations:   . Attends Archivist Meetings:   Marland Kitchen Marital Status:   Intimate Partner Violence:   . Fear of Current or Ex-Partner:   . Emotionally Abused:   Marland Kitchen Physically Abused:   .  Sexually Abused:     Review of Systems  Constitutional: Negative.   HENT: Negative.   Eyes: Negative.   Respiratory: Negative.   Cardiovascular: Negative.   Gastrointestinal: Negative.   Endocrine: Negative.   Genitourinary: Negative.   Musculoskeletal: Negative.   Skin: Negative.   Allergic/Immunologic: Negative.   Neurological: Negative.   Hematological: Negative.   Psychiatric/Behavioral: Negative.     PHYSICAL EXAMINATION:    BP 118/70 (BP Location: Right Arm, Patient Position: Sitting, Cuff Size: Normal)   Pulse 76   Resp 14   Ht 5\' 5"  (1.651 m)   Wt 196 lb (88.9 kg)   LMP 06/17/2017 (Within Years)   BMI 32.62 kg/m     General appearance: alert, cooperative and appears stated age    Pelvic: External genitalia:  Right vulva with suture present in inferior labia majora, pigmentation noted.              Excision vulvar lesion. Consent done.   Sterile prep with betadine.  Local 1% lidocaine - lot [id number], exp May 2024. Sharp excision with scalpel, 1.4 cm.   Tissue to  pathology.  Interrupted sutures of 3/0 Vicryl.  Minimal EBL.  No complications.  Chaperone was present for exam.  ASSESSMENT  Atypical nevus of vulva.  Excision today.   PLAN  FU pathology.  Instructions and precautions given.  I told her the sutures could be removed after 2 weeks if needed. She will have skin checks with dermatology.  Her family goes to [facility].  FU prn.

## 2019-10-30 ENCOUNTER — Ambulatory Visit (INDEPENDENT_AMBULATORY_CARE_PROVIDER_SITE_OTHER): Payer: BC Managed Care – PPO | Admitting: Obstetrics and Gynecology

## 2019-10-30 ENCOUNTER — Encounter: Payer: Self-pay | Admitting: Obstetrics and Gynecology

## 2019-10-30 ENCOUNTER — Other Ambulatory Visit: Payer: Self-pay

## 2019-10-30 ENCOUNTER — Other Ambulatory Visit (HOSPITAL_COMMUNITY)
Admission: RE | Admit: 2019-10-30 | Discharge: 2019-10-30 | Disposition: A | Discharge status: Home / Self Care | Payer: BC Managed Care – PPO | Source: Ambulatory Visit | Attending: Obstetrics and Gynecology | Admitting: Obstetrics and Gynecology

## 2019-10-30 VITALS — BP 118/70 | HR 76 | Resp 14 | Ht 65.0 in | Wt 196.0 lb

## 2019-10-30 DIAGNOSIS — D229 Melanocytic nevi, unspecified: Secondary | ICD-10-CM | POA: Diagnosis not present

## 2019-10-30 NOTE — Patient Instructions (Signed)
Vulva Biopsy, Care After This sheet gives you information about how to care for yourself after your procedure. Your health care provider may also give you more specific instructions. If you have problems or questions, contact your health care provider. What can I expect after the procedure? After the procedure, it is common to have:  Slight bleeding from the biopsy site.  Discomfort at the biopsy site. Follow these instructions at home: Biopsy site care   Follow instructions from your health care provider about how to take care of your biopsy site. Make sure you: ? Clean the area using water and mild soap twice a day or as told by your health care provider. Gently pat the area dry. ? If you were prescribed an antibiotic ointment, apply it as told by your health care provider. Do not stop using the antibiotic even if your condition improves. ? Take a warm water bath (sitz bath) as needed to help with pain and discomfort. A sitz bath is taken while you are sitting down. The water should only come up to your hips and should cover your buttocks. ? Leave stitches (sutures), skin glue, or adhesive strips in place. These skin closures may need to stay in place for 2 weeks or longer. If adhesive strip edges start to loosen and curl up, you may trim the loose edges. Do not remove adhesive strips completely unless your health care provider tells you to do that.  Check your biopsy site every day for signs of infection. Check for: ? More redness, swelling, or pain. ? More fluid or blood. ? Warmth. ? Pus or a bad smell.  Do not rub the biopsy area after urinating. Gently pat the area dry or use a bottle filled with warm water (peri-bottle) to clean the area. Gently wipe from front to back. Lifestyle  Wear loose, cotton underwear. Do not wear tight pants.  Do not use a tampon, douche, or put anything inside your vagina for at least 1 week or until your health care provider approves.  Do not have sex  for at least 1 week or until your health care provider approves.  Do not exercise, such as running or biking, until your health care provider approves.  Do not swim or use a hot tub until your health care provider approves. You may shower or take a sitz bath. General instructions  Take over-the-counter and prescription medicines only as told by your health care provider.  Use a sanitary napkin until the bleeding stops.  Keep all follow-up visits as told by your health care provider. This is important. Contact a health care provider if:  You have more redness, swelling, or pain around your biopsy site.  You have more fluid or blood coming from your biopsy site.  Your biopsy site feels warm to the touch.  Your pain is not controlled with medicine. Get help right away if you have:  Heavy bleeding from the vulva.  Pus or a bad smell coming from your biopsy site.  A fever.  Lower abdominal pain. Summary  After the procedure, it is common to have slight bleeding and discomfort at the biopsy site.  Follow instructions from your health care provider after your biopsy. Make sure you clean the area with water and mild soap. Pat the area dry.  Take sitz baths as needed to help with pain and discomfort. Leave any sutures in place.  Check your biopsy site for signs of infection, which may include more redness, swelling, pain, fluid,  or blood, or feeling warm to the touch.  Get help right away if you have heavy bleeding, a fever, pus or a bad smell, or pain in the lower abdomen. This information is not intended to replace advice given to you by your health care provider. Make sure you discuss any questions you have with your health care provider. Document Revised: 09/05/2017 Document Reviewed: 09/05/2017 Elsevier Patient Education  2020 [employer].

## 2019-11-03 ENCOUNTER — Other Ambulatory Visit: Payer: Self-pay | Admitting: Obstetrics and Gynecology

## 2019-11-03 DIAGNOSIS — Z1231 Encounter for screening mammogram for malignant neoplasm of breast: Secondary | ICD-10-CM

## 2019-11-05 LAB — SURGICAL PATHOLOGY

## 2019-11-19 ENCOUNTER — Inpatient Hospital Stay: Admission: RE | Admit: 2019-11-19 | Payer: BC Managed Care – PPO | Source: Ambulatory Visit

## 2019-12-09 ENCOUNTER — Other Ambulatory Visit: Payer: Self-pay | Admitting: Obstetrics and Gynecology

## 2019-12-09 DIAGNOSIS — Z1231 Encounter for screening mammogram for malignant neoplasm of breast: Secondary | ICD-10-CM

## 2019-12-24 ENCOUNTER — Ambulatory Visit
Admission: RE | Admit: 2019-12-24 | Discharge: 2019-12-24 | Disposition: A | Discharge status: Home / Self Care | Payer: BC Managed Care – PPO | Source: Ambulatory Visit

## 2019-12-24 ENCOUNTER — Other Ambulatory Visit: Payer: Self-pay

## 2019-12-24 DIAGNOSIS — Z1231 Encounter for screening mammogram for malignant neoplasm of breast: Secondary | ICD-10-CM

## 2020-02-05 ENCOUNTER — Ambulatory Visit: Payer: BC Managed Care – PPO

## 2020-02-08 ENCOUNTER — Other Ambulatory Visit: Payer: Self-pay

## 2020-02-08 ENCOUNTER — Ambulatory Visit (INDEPENDENT_AMBULATORY_CARE_PROVIDER_SITE_OTHER): Payer: BC Managed Care – PPO

## 2020-02-08 VITALS — BP 118/70 | HR 68 | Ht 65.0 in | Wt 196.0 lb

## 2020-02-08 DIAGNOSIS — Z23 Encounter for immunization: Secondary | ICD-10-CM

## 2020-02-08 DIAGNOSIS — Z01419 Encounter for gynecological examination (general) (routine) without abnormal findings: Secondary | ICD-10-CM | POA: Diagnosis not present

## 2020-02-08 NOTE — Progress Notes (Signed)
Patient in today for her 3rd Gardasil injection.   Contraception: None  LMP: Unknown  Last AEX: 10/05/19  with Dr Quincy Simmonds   Injection given in right delt. Patient tolerated shot well.   Patient informed she has completed injections today.   Routed to provider for final review.  Encounter closed.

## 2020-09-11 IMAGING — MG MM DIGITAL SCREENING BILAT W/ TOMO W/ CAD
8 series · 8 of 24 positions shown · non-contrast
Comparison: None.

CLINICAL DATA: Screening. This is the patient's initial baseline
mammogram.

EXAM:
DIGITAL SCREENING BILATERAL MAMMOGRAM WITH TOMO AND CAD

[R MLO synth-2D]
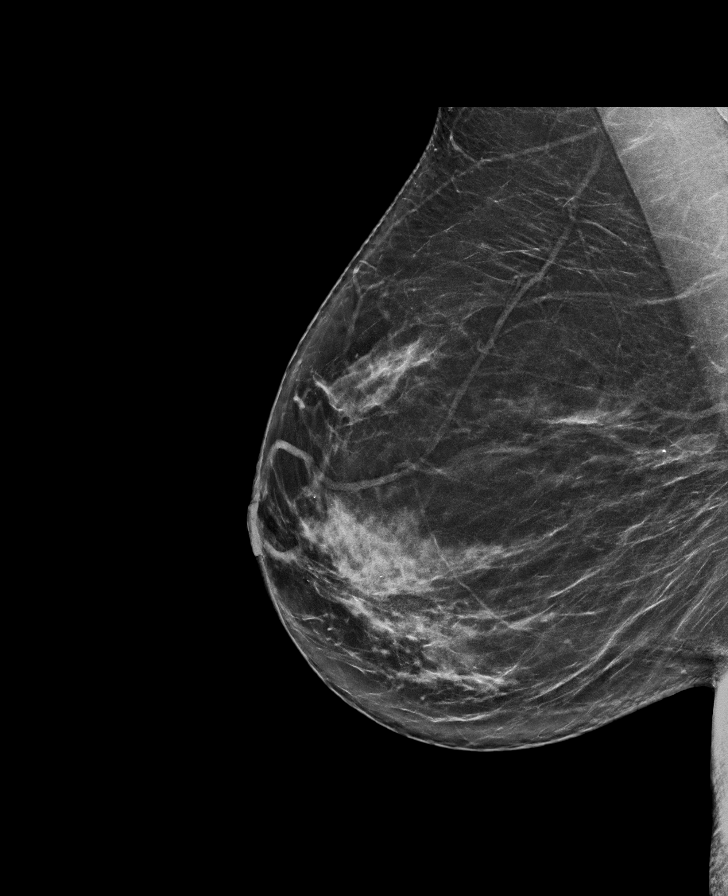

[L MLO synth-2D]
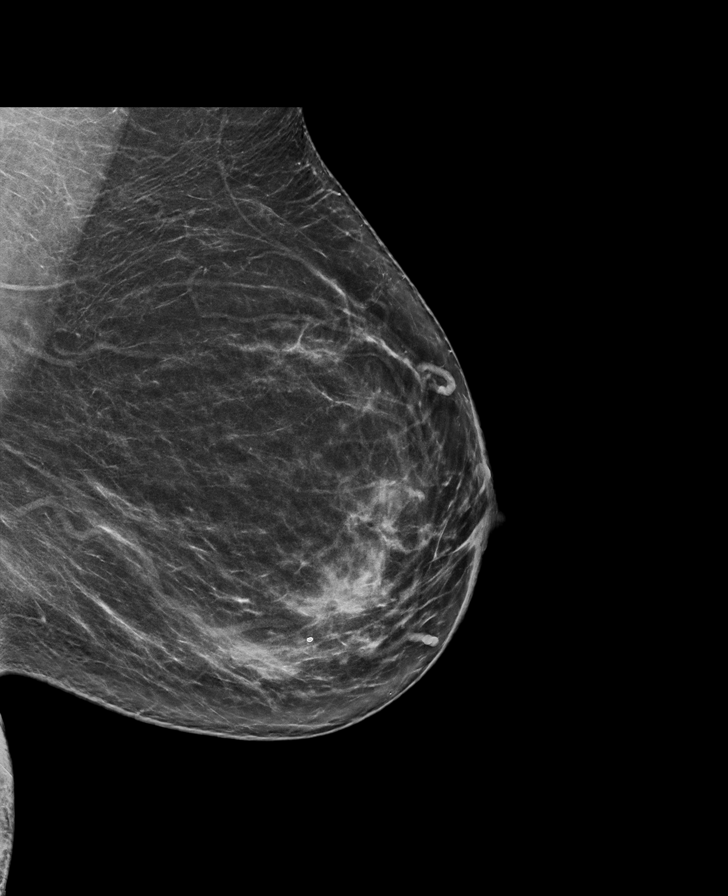

[R CC synth-2D]
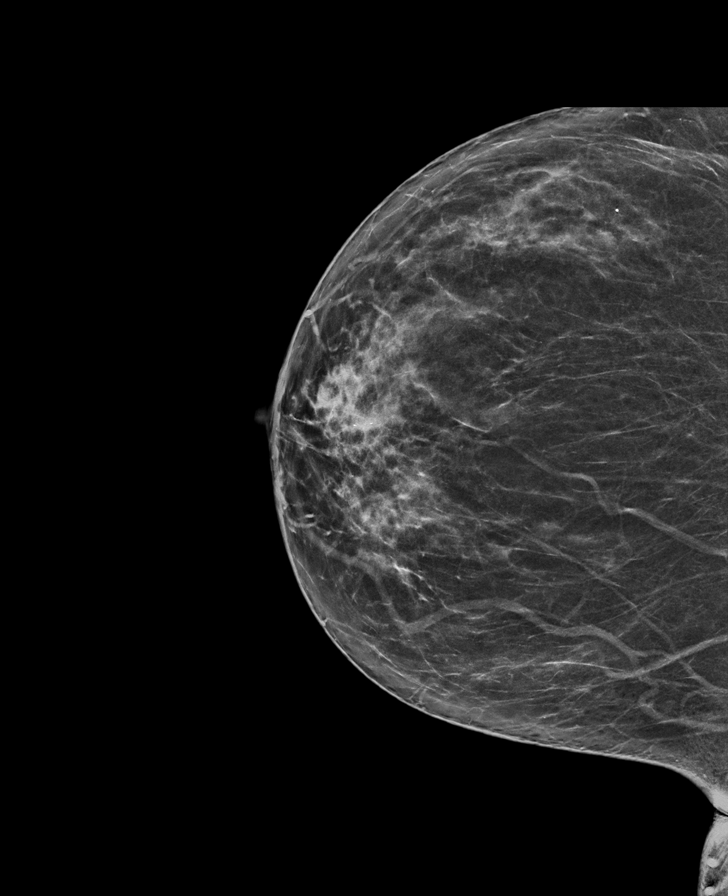

[L CC synth-2D]
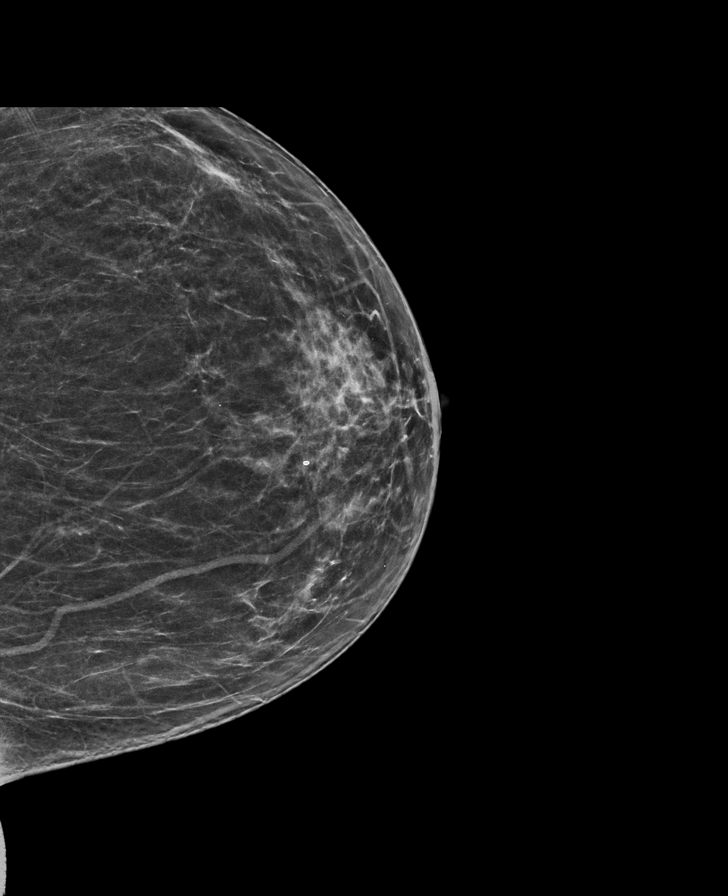

[L MLO tomo · tomo slice 41/80.0]
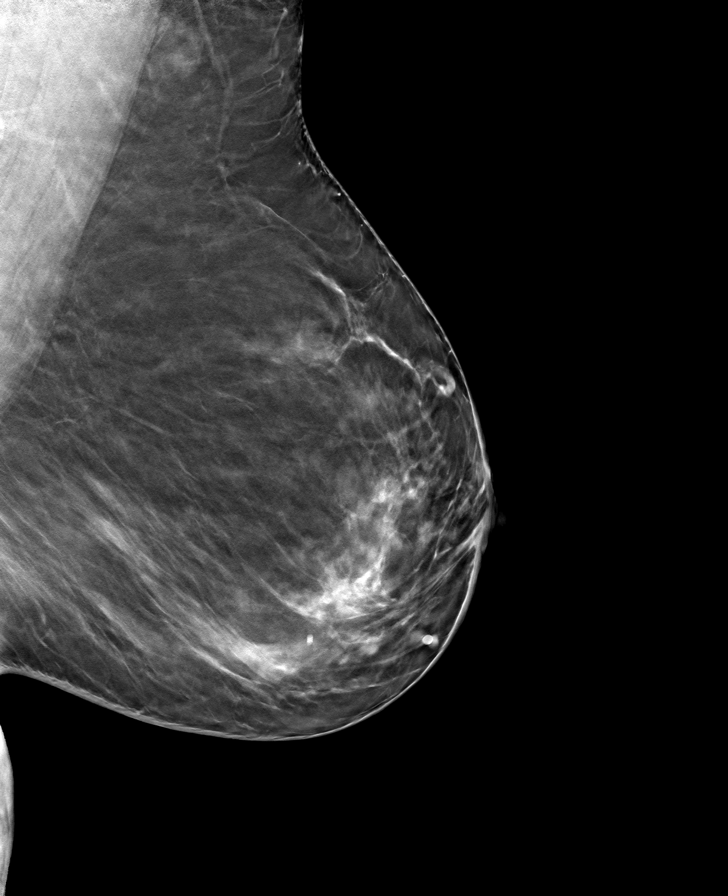

[R CC tomo · tomo slice 36/71.0]
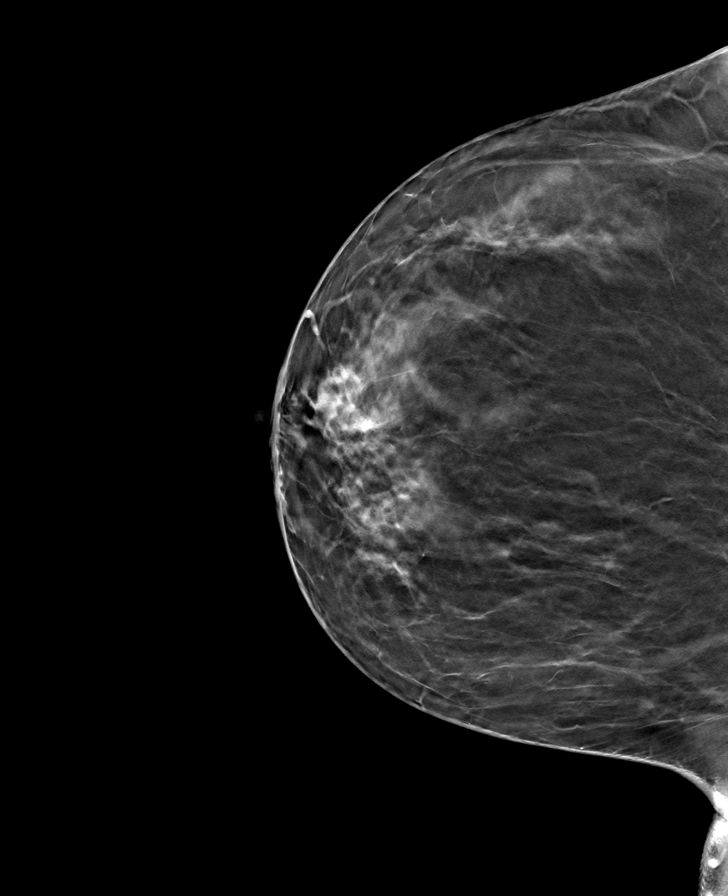

[R MLO tomo · tomo slice 41/81.0]
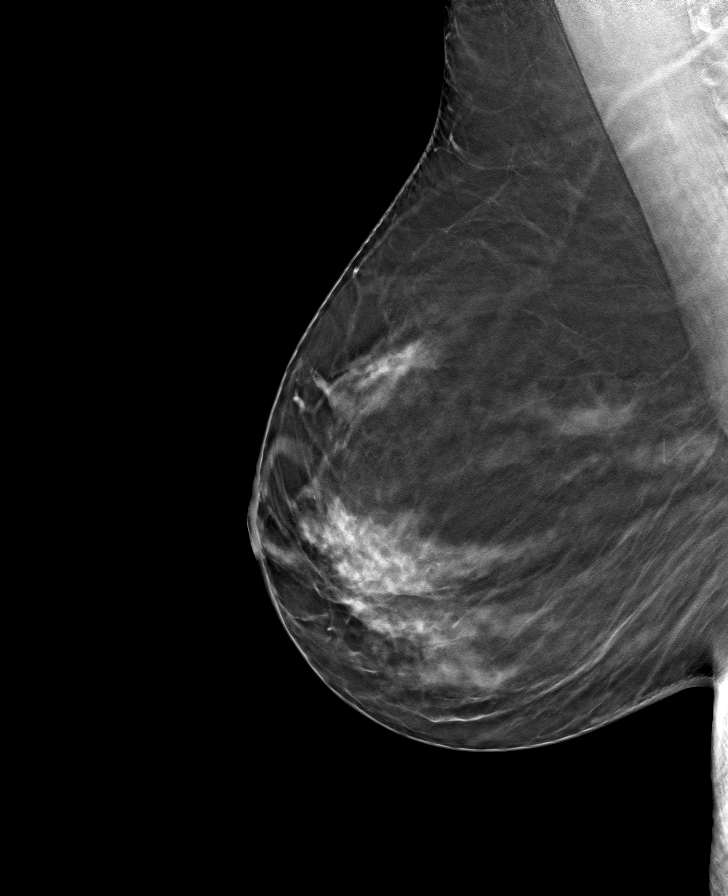

[L CC tomo · tomo slice 35/68.0]
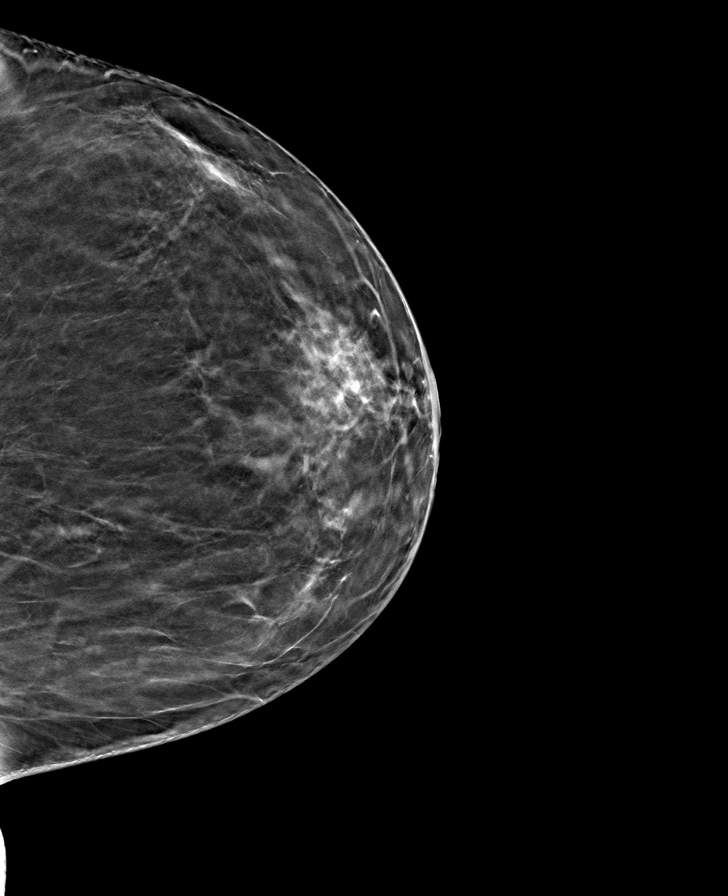

[8 of 24 positions shown; findings below may reference images not displayed]

ACR Breast Density Category b: There are scattered areas of
fibroglandular density.
FINDINGS: There are no findings suspicious for malignancy. Images were
processed with CAD.
IMPRESSION: No mammographic evidence of malignancy. A result letter of this
screening mammogram will be mailed directly to the patient.

RECOMMENDATION:
Screening mammogram in one year. (Code:PW-[id number])

BI-RADS CATEGORY  1: Negative.

## 2020-10-03 NOTE — Progress Notes (Signed)
45 y.o. G18P0002 Married Caucasian female here for annual exam.    Doing hormone therapy through [facility].  Doing testosterone pellets.  No estrogen or progesterone therapy.  No hot flashes.   Has routine blood work through [employer] [facility].  Normal.   Saw dermatology for skin check beginning of this year.   Did her Covid booster.   Touring colleges.   PCP:   None  Patient's last menstrual period was 06/18/2018 (approximate).           Sexually active: Yes.    The current method of family planning is post menopausal status.    Exercising: Yes.     Pickle ball, pelloton, walking Smoker:  no  Health Maintenance: Pap: 10-01-18 Neg:Neg HR HPV, 12/08/12 Neg:Neg HR HPV, 11-23-10 Neg History of abnormal Pap:  no MMG: 12-24-19 3D/Neg/BiRads1 Colonoscopy:  NEVER  BMD:   n/a  Result  n/a TDaP:  10-01-18 Gardasil:   yes, completed HIV: neg in preg Hep C: never Screening Labs:  [facility].    reports that she has never smoked. She has never used smokeless tobacco. She reports that she does not drink alcohol and does not use drugs.  Past Medical History:  Diagnosis Date   Infertility, female     Past Surgical History:  Procedure Laterality Date   CESAREAN SECTION      Current Outpatient Medications  Medication Sig Dispense Refill   clobetasol (TEMOVATE) 0.05 % external solution Apply topically 2 (two) times daily as needed.     meloxicam (MOBIC) 7.5 MG tablet Take 7.5 mg by mouth daily.     triamcinolone (KENALOG) 0.025 % ointment APPLY TOPICALLY DAILY FOR 14 DAYS. DO NOT USE ON FACE.     No current facility-administered medications for this visit.    Family History  Problem Relation Age of Onset   Osteoarthritis Mother    Hypertension Mother    Asthma Father    Thyroid disease Sister    Cancer Maternal Grandmother    Hypertension Maternal Grandmother    Stroke Maternal Grandmother     Review of Systems  All other systems reviewed and are negative.  Exam:   BP 118/72 (Cuff  Size: Large)   Pulse (!) 104   Ht 5\' 5"  (1.651 m)   Wt 196 lb (88.9 kg)   LMP 06/18/2018 (Approximate)   SpO2 99%   BMI 32.62 kg/m     General appearance: alert, cooperative and appears stated age Head: normocephalic, without obvious abnormality, atraumatic Neck: no adenopathy, supple, symmetrical, trachea midline and thyroid normal to inspection and palpation Lungs: clear to auscultation bilaterally Breasts: normal appearance, no masses or tenderness, No nipple retraction or dimpling, No nipple discharge or bleeding, No axillary adenopathy Heart: regular rate and rhythm Abdomen: soft, non-tender; no masses, no organomegaly Extremities: extremities normal, atraumatic, no cyanosis or edema Skin: skin color, texture, turgor normal. No rashes or lesions Lymph nodes: cervical, supraclavicular, and axillary nodes normal. Neurologic: grossly normal  Pelvic: External genitalia:  moderately pigmented spreading nevus of the right vulva, junction with thigh.  Mons pubis with pink light brown slightly raised nevus, well demarcated.               No abnormal inguinal nodes palpated.              Urethra:  normal appearing urethra with no masses, tenderness or lesions              Bartholins and Skenes: normal  Vagina: normal appearing vagina with normal color and discharge, no lesions              Cervix: no lesions              Pap taken: no Bimanual Exam:  Uterus:  normal size, contour, position, consistency, mobility, non-tender              Adnexa: no mass, fullness, tenderness              Rectal exam: yes.  Confirms.              Anus:  normal sphincter tone, no lesions  Chaperone was present for exam:  Estill Bamberg, CMA.  Assessment:   Well woman visit with gynecologic exam. Menopausal female. Testosterone therapy.  Hx dysplastic vulvar nevus, margins negative.  Pigmented nevus of right vulva today.   Plan: Mammogram screening discussed. Self breast awareness  reviewed. Pap and HR HPV 2025.  Guidelines for Calcium, Vitamin D, regular exercise program including cardiovascular and weight bearing exercise. Return for vulvar biopsy.  Follow up annually and prn.    After visit summary provided.

## 2020-10-05 ENCOUNTER — Encounter: Payer: Self-pay | Admitting: Obstetrics and Gynecology

## 2020-10-05 ENCOUNTER — Other Ambulatory Visit: Payer: Self-pay

## 2020-10-05 ENCOUNTER — Ambulatory Visit (INDEPENDENT_AMBULATORY_CARE_PROVIDER_SITE_OTHER): Payer: BC Managed Care – PPO | Admitting: Obstetrics and Gynecology

## 2020-10-05 VITALS — BP 118/72 | HR 104 | Ht 65.0 in | Wt 196.0 lb

## 2020-10-05 DIAGNOSIS — D229 Melanocytic nevi, unspecified: Secondary | ICD-10-CM | POA: Diagnosis not present

## 2020-10-05 DIAGNOSIS — Z01419 Encounter for gynecological examination (general) (routine) without abnormal findings: Secondary | ICD-10-CM | POA: Diagnosis not present

## 2020-10-05 NOTE — Patient Instructions (Signed)

## 2020-11-14 ENCOUNTER — Other Ambulatory Visit (HOSPITAL_COMMUNITY)
Admission: RE | Admit: 2020-11-14 | Discharge: 2020-11-14 | Disposition: A | Discharge status: Home / Self Care | Payer: BC Managed Care – PPO | Source: Ambulatory Visit | Attending: Obstetrics and Gynecology | Admitting: Obstetrics and Gynecology

## 2020-11-14 ENCOUNTER — Ambulatory Visit: Payer: BC Managed Care – PPO | Admitting: Obstetrics and Gynecology

## 2020-11-14 ENCOUNTER — Other Ambulatory Visit: Payer: Self-pay

## 2020-11-14 ENCOUNTER — Encounter: Payer: Self-pay | Admitting: Obstetrics and Gynecology

## 2020-11-14 DIAGNOSIS — D229 Melanocytic nevi, unspecified: Secondary | ICD-10-CM | POA: Diagnosis present

## 2020-11-14 NOTE — Patient Instructions (Signed)
Vulva Biopsy, Care After This sheet gives you information about how to care for yourself after your procedure. Your health care provider may also give you more specific instructions. If you have problems or questions, contact your health careprovider. What can I expect after the procedure? After the procedure, it is common to have: Slight bleeding from the biopsy site. Discomfort at the biopsy site. Follow these instructions at home: Biopsy site care  Follow instructions from your health care provider about how to take care of your biopsy site. Make sure you: Clean the area using water and mild soap twice a day or as told by your health care provider. Gently pat the area dry. If you were prescribed an antibiotic ointment, apply it as told by your health care provider. Do not stop using the antibiotic even if your condition improves. Take a warm water bath (sitz bath) as needed to help with pain and discomfort. A sitz bath is taken while you are sitting down. The water should only come up to your hips and should cover your buttocks. Leave stitches (sutures), skin glue, or adhesive strips in place. These skin closures may need to stay in place for 2 weeks or longer. If adhesive strip edges start to loosen and curl up, you may trim the loose edges. Do not remove adhesive strips completely unless your health care provider tells you to do that. Check your biopsy site every day for signs of infection. Check for: More redness, swelling, or pain. More fluid or blood. Warmth. Pus or a bad smell. Do not rub the biopsy area after urinating. Gently pat the area dry or use a bottle filled with warm water (peri-bottle) to clean the area. Gently wipe from front to back.  Lifestyle Wear loose, cotton underwear. Do not wear tight pants. Do not use a tampon, douche, or put anything inside your vagina for at least 1 week or until your health care provider approves. Do not have sex for at least 1 week or until  your health care provider approves. Do not exercise, such as running or biking, until your health care provider approves. Do not swim or use a hot tub until your health care provider approves. You may shower or take a sitz bath. General instructions Take over-the-counter and prescription medicines only as told by your health care provider. Use a sanitary napkin until the bleeding stops. Keep all follow-up visits as told by your health care provider. This is important. Contact a health care provider if: You have more redness, swelling, or pain around your biopsy site. You have more fluid or blood coming from your biopsy site. Your biopsy site feels warm to the touch. Your pain is not controlled with medicine. Get help right away if you have: Heavy bleeding from the vulva. Pus or a bad smell coming from your biopsy site. A fever. Lower abdominal pain. Summary After the procedure, it is common to have slight bleeding and discomfort at the biopsy site. Follow instructions from your health care provider after your biopsy. Make sure you clean the area with water and mild soap. Pat the area dry. Take sitz baths as needed to help with pain and discomfort. Leave any sutures in place. Check your biopsy site for signs of infection, which may include more redness, swelling, pain, fluid, or blood, or feeling warm to the touch. Get help right away if you have heavy bleeding, a fever, pus or a bad smell, or pain in the lower abdomen. This information is  not intended to replace advice given to you by your health care provider. Make sure you discuss any questions you have with your healthcare provider. Document Revised: 09/05/2017 Document Reviewed: 09/05/2017 Elsevier Patient Education  2022 [employer].

## 2020-11-14 NOTE — Progress Notes (Signed)
GYNECOLOGY  VISIT   HPI: 45 y.o.   Married  Caucasian  female   G1P0002 with Patient's last menstrual period was 07/08/2018 (approximate).   here for removal of a pigmented vulvar nevus.   GYNECOLOGIC HISTORY: Patient's last menstrual period was 07/08/2018 (approximate). Contraception: PMP Menopausal hormone therapy: none Last mammogram: 12-24-19 3D/Neg/BiRads1 Last pap smear: 10-01-18 Neg:Neg HR HPV, 12/08/12 Neg:Neg HR HPV, 11-23-10 Neg        OB History     Gravida  1   Para  0   Term      Preterm  0   AB      Living  2      SAB      IAB      Ectopic      Multiple  1   Live Births  2              There are no problems to display for this patient.   Past Medical History:  Diagnosis Date   Infertility, female     Past Surgical History:  Procedure Laterality Date   CESAREAN SECTION      Current Outpatient Medications  Medication Sig Dispense Refill   clobetasol (TEMOVATE) 0.05 % external solution Apply topically 2 (two) times daily as needed.     triamcinolone (KENALOG) 0.025 % ointment APPLY TOPICALLY DAILY FOR 14 DAYS. DO NOT USE ON FACE.     meloxicam (MOBIC) 7.5 MG tablet Take 7.5 mg by mouth daily.     No current facility-administered medications for this visit.     ALLERGIES: Patient has no known allergies.  Family History  Problem Relation Age of Onset   Osteoarthritis Mother    Hypertension Mother    Asthma Father    Thyroid disease Sister    Cancer Maternal Grandmother    Hypertension Maternal Grandmother    Stroke Maternal Grandmother     Social History   Socioeconomic History   Marital status: Married    Spouse name: Not on file   Number of children: Not on file   Years of education: Not on file   Highest education level: Not on file  Occupational History   Not on file  Tobacco Use   Smoking status: Never   Smokeless tobacco: Never  Vaping Use   Vaping Use: Never used  Substance and Sexual Activity   Alcohol use: No     Alcohol/week: 0.0 standard drinks   Drug use: No   Sexual activity: Yes    Partners: Male    Birth control/protection: None  Other Topics Concern   Not on file  Social History Narrative   Not on file   Social Determinants of Health   Financial Resource Strain: Not on file  Food Insecurity: Not on file  Transportation Needs: Not on file  Physical Activity: Not on file  Stress: Not on file  Social Connections: Not on file  Intimate Partner Violence: Not on file    Review of Systems  All other systems reviewed and are negative.  PHYSICAL EXAMINATION:    BP 122/76   Pulse 75   Ht 5' 5.25" (1.657 m)   Wt 196 lb (88.9 kg)   LMP 07/08/2018 (Approximate)   SpO2 97%   BMI 32.37 kg/m     General appearance: alert, cooperative and appears stated age  Pelvic: External genitalia:  right mons pubis with 4 mm moderate brown nevus.  Vulvar biopsy Consent for procedure.  Betadine prep.  Local 1% lidocaine.  5 mm punch biopsy used.  Tissue to pathology.  2 interrupted sutures of 3/0 Vicryl.  No complications.  Minimal EBL.  Sterile gauze dressing placed.             Chaperone was present for exam:  Estill Bamberg, CMA.   ASSESSMENT  Pigmented vulvar nevus.   PLAN  FU biopsy.  Post biopsy instructions given.  FU in 2 weeks for suture removal if sutures are still present.    An After Visit Summary was printed and given to the patient.

## 2020-11-17 LAB — SURGICAL PATHOLOGY

## 2020-11-22 ENCOUNTER — Other Ambulatory Visit: Payer: Self-pay

## 2020-11-22 DIAGNOSIS — N903 Dysplasia of vulva, unspecified: Secondary | ICD-10-CM

## 2021-01-04 NOTE — Progress Notes (Signed)
GYNECOLOGY  VISIT   HPI: 45 y.o.   Married  Caucasian  female   G1P0002 with Patient's last menstrual period was 07/08/2018 (approximate).   here for vulvar biopsy.  She had biopsy of a vulvar junctional dysplastic nevus with mild atypia, margins involved.  She has returned to have further excision.   She also has a larger mons pubis pigmented nevus that will be biopsied today.  GYNECOLOGIC HISTORY: Patient's last menstrual period was 07/08/2018 (approximate). Contraception:  post menopausal Menopausal hormone therapy:  none Last mammogram:  12-24-19 birads 1:neg Last pap smear:   10-01-18 neg HPV HR neg        OB History     Gravida  1   Para  0   Term      Preterm  0   AB      Living  2      SAB      IAB      Ectopic      Multiple  1   Live Births  2              There are no problems to display for this patient.   Past Medical History:  Diagnosis Date   Infertility, female     Past Surgical History:  Procedure Laterality Date   CESAREAN SECTION      Current Outpatient Medications  Medication Sig Dispense Refill   clobetasol (TEMOVATE) 0.05 % external solution Apply topically 2 (two) times daily as needed.     triamcinolone (KENALOG) 0.025 % ointment APPLY TOPICALLY DAILY FOR 14 DAYS. DO NOT USE ON FACE.     No current facility-administered medications for this visit.     ALLERGIES: Patient has no known allergies.  Family History  Problem Relation Age of Onset   Osteoarthritis Mother    Hypertension Mother    Asthma Father    Thyroid disease Sister    Cancer Maternal Grandmother    Hypertension Maternal Grandmother    Stroke Maternal Grandmother     Social History   Socioeconomic History   Marital status: Married    Spouse name: Not on file   Number of children: Not on file   Years of education: Not on file   Highest education level: Not on file  Occupational History   Not on file  Tobacco Use   Smoking status: Never    Smokeless tobacco: Never  Vaping Use   Vaping Use: Never used  Substance and Sexual Activity   Alcohol use: No    Alcohol/week: 0.0 standard drinks   Drug use: No   Sexual activity: Yes    Partners: Male    Birth control/protection: Post-menopausal  Other Topics Concern   Not on file  Social History Narrative   Not on file   Social Determinants of Health   Financial Resource Strain: Not on file  Food Insecurity: Not on file  Transportation Needs: Not on file  Physical Activity: Not on file  Stress: Not on file  Social Connections: Not on file  Intimate Partner Violence: Not on file    Review of Systems  Constitutional: Negative.   HENT: Negative.    Eyes: Negative.   Respiratory: Negative.    Cardiovascular: Negative.   Gastrointestinal: Negative.   Endocrine: Negative.   Genitourinary: Negative.   Musculoskeletal: Negative.   Skin: Negative.   Allergic/Immunologic: Negative.   Neurological: Negative.   Hematological: Negative.   Psychiatric/Behavioral: Negative.     PHYSICAL EXAMINATION:  BP 104/70   Pulse 83   Resp 16   Wt 187 lb (84.8 kg)   LMP 07/08/2018 (Approximate)   BMI 30.88 kg/m     General appearance: alert, cooperative and appears stated age    Pelvic:  External genitalia:   Mons pubis 1.3 cm raised pigmented pinkbrown colored nevus Right labia majora with linear vertical well healed incision and no pigmentation noted.   Procedures: Vulvar punch biopsy and separate excisional biopsy of vulva.  Consent for procedures.  Betadine prep.  Local 1% lidocaine - lot [id number], exp April 2024 . 1 - mons pubis 4 mm punch biopsy of 1.3 cm raised pigmented pinkbrown colored nevus.  Tissue to pathology.  Incision closed with single 3/0 Vicryl suture. 2 - right labia majora excisional biopsy performed with a scalpel in an ellipse after skin edges marked with a surgical marking pen.  Area excised 10 mm x 5 mm.  Incision closed with interrupted 3/0 Vicryl  sutures. Tissue to pathology separately.   Minimal bleeding.  No complications.  Skin cleansed of betadine and dressings placed.   Chaperone was present for exam:  Joy, CMA  ASSESSMENT  Pigmented nevus of mons pubis.  Prior dysplastic nevus of a pigmented nevus of the labia majora with positive margin.    PLAN  Fu pathology results.  Post biopsy care reviewed.  Ibuprofen 800 mg every 8 hours prn.  Ice pack.  Can return for suture removal in 3 weeks if desired.  FU prn.    An After Visit Summary was printed and given to the patient.

## 2021-01-06 ENCOUNTER — Encounter: Payer: Self-pay | Admitting: Obstetrics and Gynecology

## 2021-01-06 ENCOUNTER — Other Ambulatory Visit (HOSPITAL_COMMUNITY)
Admission: RE | Admit: 2021-01-06 | Discharge: 2021-01-06 | Disposition: A | Discharge status: Home / Self Care | Payer: BC Managed Care – PPO | Source: Ambulatory Visit | Attending: Obstetrics and Gynecology | Admitting: Obstetrics and Gynecology

## 2021-01-06 ENCOUNTER — Other Ambulatory Visit: Payer: Self-pay

## 2021-01-06 ENCOUNTER — Ambulatory Visit: Payer: BC Managed Care – PPO | Admitting: Obstetrics and Gynecology

## 2021-01-06 VITALS — BP 104/70 | HR 83 | Resp 16 | Wt 187.0 lb

## 2021-01-06 DIAGNOSIS — D229 Melanocytic nevi, unspecified: Secondary | ICD-10-CM | POA: Insufficient documentation

## 2021-01-06 DIAGNOSIS — N903 Dysplasia of vulva, unspecified: Secondary | ICD-10-CM | POA: Insufficient documentation

## 2021-01-06 NOTE — Patient Instructions (Signed)
Vulva Biopsy, Care After This sheet gives you information about how to care for yourself after your procedure. Your health care provider may also give you more specific instructions. If you have problems or questions, contact your health care provider. What can I expect after the procedure? After the procedure, it is common to have: Slight bleeding from the biopsy site. Discomfort at the biopsy site. Follow these instructions at home: Biopsy site care  Follow instructions from your health care provider about how to take care of your biopsy site. Make sure you: Clean the area using water and mild soap twice a day or as told by your health care provider. Gently pat the area dry. If you were prescribed an antibiotic ointment, apply it as told by your health care provider. Do not stop using the antibiotic even if your condition improves. Take a warm water bath (sitz bath) as needed to help with pain and discomfort. A sitz bath is taken while you are sitting down. The water should only come up to your hips and should cover your buttocks. Leave stitches (sutures), skin glue, or adhesive strips in place. These skin closures may need to stay in place for 2 weeks or longer. If adhesive strip edges start to loosen and curl up, you may trim the loose edges. Do not remove adhesive strips completely unless your health care provider tells you to do that. Check your biopsy site every day for signs of infection. Check for: More redness, swelling, or pain. More fluid or blood. Warmth. Pus or a bad smell. Do not rub the biopsy area after urinating. Gently pat the area dry or use a bottle filled with warm water (peri-bottle) to clean the area. Gently wipe from front to back. Lifestyle Wear loose, cotton underwear. Do not wear tight pants. Do not use a tampon, douche, or put anything inside your vagina for at least 1 week or until your health care provider approves. Do not have sex for at least 1 week or until  your health care provider approves. Do not exercise, such as running or biking, until your health care provider approves. Do not swim or use a hot tub until your health care provider approves. You may shower or take a sitz bath. General instructions Take over-the-counter and prescription medicines only as told by your health care provider. Use a sanitary napkin until the bleeding stops. Keep all follow-up visits as told by your health care provider. This is important. Contact a health care provider if: You have more redness, swelling, or pain around your biopsy site. You have more fluid or blood coming from your biopsy site. Your biopsy site feels warm to the touch. Your pain is not controlled with medicine. Get help right away if you have: Heavy bleeding from the vulva. Pus or a bad smell coming from your biopsy site. A fever. Lower abdominal pain. Summary After the procedure, it is common to have slight bleeding and discomfort at the biopsy site. Follow instructions from your health care provider after your biopsy. Make sure you clean the area with water and mild soap. Pat the area dry. Take sitz baths as needed to help with pain and discomfort. Leave any sutures in place. Check your biopsy site for signs of infection, which may include more redness, swelling, pain, fluid, or blood, or feeling warm to the touch. Get help right away if you have heavy bleeding, a fever, pus or a bad smell, or pain in the lower abdomen. This information is  not intended to replace advice given to you by your health care provider. Make sure you discuss any questions you have with your health care provider. Document Revised: 09/05/2017 Document Reviewed: 09/05/2017 Elsevier Patient Education  2022 [employer].

## 2021-01-11 ENCOUNTER — Other Ambulatory Visit: Payer: Self-pay | Admitting: Obstetrics and Gynecology

## 2021-01-11 DIAGNOSIS — Z1231 Encounter for screening mammogram for malignant neoplasm of breast: Secondary | ICD-10-CM

## 2021-01-11 LAB — SURGICAL PATHOLOGY

## 2021-01-14 ENCOUNTER — Ambulatory Visit
Admission: RE | Admit: 2021-01-14 | Discharge: 2021-01-14 | Disposition: A | Discharge status: Home / Self Care | Payer: BC Managed Care – PPO | Source: Ambulatory Visit

## 2021-01-14 DIAGNOSIS — Z1231 Encounter for screening mammogram for malignant neoplasm of breast: Secondary | ICD-10-CM

## 2021-02-16 ENCOUNTER — Ambulatory Visit: Payer: BC Managed Care – PPO | Admitting: Obstetrics and Gynecology

## 2021-02-16 ENCOUNTER — Encounter: Payer: Self-pay | Admitting: Obstetrics and Gynecology

## 2021-02-16 ENCOUNTER — Other Ambulatory Visit: Payer: Self-pay

## 2021-02-16 VITALS — BP 120/82 | HR 76 | Resp 12 | Ht 65.0 in | Wt 184.0 lb

## 2021-02-16 DIAGNOSIS — T8189XA Other complications of procedures, not elsewhere classified, initial encounter: Secondary | ICD-10-CM

## 2021-02-16 NOTE — Progress Notes (Signed)
GYNECOLOGY  VISIT   HPI: 45 y.o.   Married White or Caucasian Not Hispanic or Latino  female   [phone] with Patient's last menstrual period was 07/08/2018 (approximate).   here for vulvar stitch biopsy removal. She has 3 residual stitches from 01/06/21 vulvar biopsies, one on the mons and 2 on her vulva. They are bothering her   GYNECOLOGIC HISTORY: Patient's last menstrual period was 07/08/2018 (approximate). Contraception:post menopausal  Menopausal hormone therapy: none        OB History     Gravida  1   Para  0   Term      Preterm  0   AB      Living  2      SAB      IAB      Ectopic      Multiple  1   Live Births  2              There are no problems to display for this patient.   Past Medical History:  Diagnosis Date   Infertility, female     Past Surgical History:  Procedure Laterality Date   CESAREAN SECTION      Current Outpatient Medications  Medication Sig Dispense Refill   clobetasol (TEMOVATE) 0.05 % external solution Apply topically 2 (two) times daily as needed.     triamcinolone (KENALOG) 0.025 % ointment APPLY TOPICALLY DAILY FOR 14 DAYS. DO NOT USE ON FACE.     No current facility-administered medications for this visit.     ALLERGIES: Patient has no known allergies.  Family History  Problem Relation Age of Onset   Osteoarthritis Mother    Hypertension Mother    Asthma Father    Thyroid disease Sister    Cancer Maternal Grandmother    Hypertension Maternal Grandmother    Stroke Maternal Grandmother     Social History   Socioeconomic History   Marital status: Married    Spouse name: Not on file   Number of children: Not on file   Years of education: Not on file   Highest education level: Not on file  Occupational History   Not on file  Tobacco Use   Smoking status: Never   Smokeless tobacco: Never  Vaping Use   Vaping Use: Never used  Substance and Sexual Activity   Alcohol use: No    Alcohol/week: 0.0  standard drinks   Drug use: No   Sexual activity: Yes    Partners: Male    Birth control/protection: Post-menopausal  Other Topics Concern   Not on file  Social History Narrative   Not on file   Social Determinants of Health   Financial Resource Strain: Not on file  Food Insecurity: Not on file  Transportation Needs: Not on file  Physical Activity: Not on file  Stress: Not on file  Social Connections: Not on file  Intimate Partner Violence: Not on file    Review of Systems  All other systems reviewed and are negative.  PHYSICAL EXAMINATION:    BP 120/82 (BP Location: Left Arm, Cuff Size: Normal)   Pulse 76   Resp 12   Ht 5\' 5"  (1.651 m)   Wt 184 lb (83.5 kg)   LMP 07/08/2018 (Approximate)   BMI 30.62 kg/m     General appearance: alert, cooperative and appears stated age   Pelvic: External genitalia:  no lesions, stitch on mons and 2 stitches on the right labia majora were removed.  Urethra:  normal appearing urethra with no masses, tenderness or lesions              Chaperone was present for exam.  1. Suture reaction, initial encounter Irritation, no concerning findings. Stitches removed.

## 2021-10-09 NOTE — Progress Notes (Deleted)
46 y.o. G29P0002 Married Caucasian female here for annual exam.    PCP:   None  Patient's last menstrual period was 06/18/2018 (approximate).           Sexually active: {yes no:314532}  The current method of family planning is post menopausal status.    Exercising: {yes no:314532}  {types:19826} Smoker:  no  Health Maintenance: Pap:   10-01-18 Neg:Neg HR HPV, 12/08/12 Neg:Neg HR HPV, 11-23-10 Neg History of abnormal Pap:  no MMG:  01-14-21 Neg/BiRads1 Colonoscopy:  ***NEVER BMD:  n/a  Result  n/a TDaP:  10-01-18 Gardasil:   Completed HIV: neg in past Hep C: no Screening Labs:  Hb today: ***, Urine today: ***   reports that she has never smoked. She has never used smokeless tobacco. She reports that she does not drink alcohol and does not use drugs.  Past Medical History:  Diagnosis Date   Infertility, female     Past Surgical History:  Procedure Laterality Date   CESAREAN SECTION      Current Outpatient Medications  Medication Sig Dispense Refill   clobetasol (TEMOVATE) 0.05 % external solution Apply topically 2 (two) times daily as needed.     triamcinolone (KENALOG) 0.025 % ointment APPLY TOPICALLY DAILY FOR 14 DAYS. DO NOT USE ON FACE.     No current facility-administered medications for this visit.    Family History  Problem Relation Age of Onset   Osteoarthritis Mother    Hypertension Mother    Asthma Father    Thyroid disease Sister    Cancer Maternal Grandmother    Hypertension Maternal Grandmother    Stroke Maternal Grandmother     Review of Systems  Exam:   LMP 06/18/2018 (Approximate)     General appearance: alert, cooperative and appears stated age Head: normocephalic, without obvious abnormality, atraumatic Neck: no adenopathy, supple, symmetrical, trachea midline and thyroid normal to inspection and palpation Lungs: clear to auscultation bilaterally Breasts: normal appearance, no masses or tenderness, No nipple retraction or dimpling, No nipple  discharge or bleeding, No axillary adenopathy Heart: regular rate and rhythm Abdomen: soft, non-tender; no masses, no organomegaly Extremities: extremities normal, atraumatic, no cyanosis or edema Skin: skin color, texture, turgor normal. No rashes or lesions Lymph nodes: cervical, supraclavicular, and axillary nodes normal. Neurologic: grossly normal  Pelvic: External genitalia:  no lesions              No abnormal inguinal nodes palpated.              Urethra:  normal appearing urethra with no masses, tenderness or lesions              Bartholins and Skenes: normal                 Vagina: normal appearing vagina with normal color and discharge, no lesions              Cervix: no lesions              Pap taken: {yes no:314532} Bimanual Exam:  Uterus:  normal size, contour, position, consistency, mobility, non-tender              Adnexa: no mass, fullness, tenderness              Rectal exam: {yes no:314532}.  Confirms.              Anus:  normal sphincter tone, no lesions  Chaperone was present for exam:  ***  Assessment:  Well woman visit with gynecologic exam.   Plan: Mammogram screening discussed. Self breast awareness reviewed. Pap and HR HPV as above. Guidelines for Calcium, Vitamin D, regular exercise program including cardiovascular and weight bearing exercise.   Follow up annually and prn.   Additional counseling given.  {yes [id number]. _______ minutes face to face time of which over 50% was spent in counseling.    After visit summary provided.

## 2021-10-11 ENCOUNTER — Ambulatory Visit: Payer: BC Managed Care – PPO | Admitting: Obstetrics and Gynecology

## 2021-12-18 ENCOUNTER — Other Ambulatory Visit: Payer: Self-pay | Admitting: Obstetrics and Gynecology

## 2021-12-18 DIAGNOSIS — Z1231 Encounter for screening mammogram for malignant neoplasm of breast: Secondary | ICD-10-CM

## 2022-01-17 ENCOUNTER — Ambulatory Visit
Admission: RE | Admit: 2022-01-17 | Discharge: 2022-01-17 | Disposition: A | Discharge status: Home / Self Care | Payer: BC Managed Care – PPO | Source: Ambulatory Visit

## 2022-01-17 DIAGNOSIS — Z1231 Encounter for screening mammogram for malignant neoplasm of breast: Secondary | ICD-10-CM

## 2023-01-17 ENCOUNTER — Other Ambulatory Visit: Payer: Self-pay | Admitting: Obstetrics and Gynecology

## 2023-01-17 DIAGNOSIS — Z1231 Encounter for screening mammogram for malignant neoplasm of breast: Secondary | ICD-10-CM

## 2023-02-12 ENCOUNTER — Ambulatory Visit
Admission: RE | Admit: 2023-02-12 | Discharge: 2023-02-12 | Disposition: A | Discharge status: Home / Self Care | Payer: BC Managed Care – PPO | Source: Ambulatory Visit

## 2023-02-12 DIAGNOSIS — Z1231 Encounter for screening mammogram for malignant neoplasm of breast: Secondary | ICD-10-CM

## 2023-11-01 ENCOUNTER — Ambulatory Visit (INDEPENDENT_AMBULATORY_CARE_PROVIDER_SITE_OTHER): Payer: Self-pay | Admitting: Physician Assistant

## 2023-11-01 ENCOUNTER — Encounter: Payer: Self-pay | Admitting: Physician Assistant

## 2023-11-01 VITALS — BP 118/81 | HR 71 | Temp 97.3°F | Ht 65.0 in | Wt 165.4 lb

## 2023-11-01 DIAGNOSIS — Z1211 Encounter for screening for malignant neoplasm of colon: Secondary | ICD-10-CM

## 2023-11-01 DIAGNOSIS — Z6827 Body mass index (BMI) 27.0-27.9, adult: Secondary | ICD-10-CM | POA: Diagnosis not present

## 2023-11-01 DIAGNOSIS — E663 Overweight: Secondary | ICD-10-CM | POA: Diagnosis not present

## 2023-11-01 MED ORDER — ZEPBOUND 2.5 MG/0.5ML ~~LOC~~ SOLN: 2.5000 mg | SUBCUTANEOUS | 0 refills | Status: DC

## 2023-11-01 NOTE — Progress Notes (Signed)
 Patient ID: Desiree Melendez, female    DOB: 20-Nov-1975, 48 y.o.   MRN: [id number]   Assessment & Plan:  Overweight with body mass index (BMI) of 27 to 27.9 in adult -     Zepbound ; Inject 2.5 mg into the skin once a week.  Dispense: 2 mL; Refill: 0  Screening for colon cancer -     Ambulatory referral to Gastroenterology      Assessment and Plan Assessment & Plan Overweight BMI is 27.5, indicating overweight status. She has previously lost weight using semaglutide and is interested in using a GLP-1 receptor agonist again to reach her goal weight. She exercises regularly and is mindful of her diet. Discussed concerns about muscle loss associated with GLP-1 receptor agonists and emphasized the importance of not losing weight too quickly to avoid adverse effects such as hair loss. She is aware of the cost implications as insurance may not cover the medication. - Prescribe Zepbound , starting with 2.5 mg once weekly. - Instruct to check in via MyChart after three injections to assess progress and adjust dosage to 5 mg if appropriate. - Schedule follow-up in 6-8 weeks to monitor progress and adjust treatment as needed. - Advise on dietary modifications to include small, frequent, high fiber, high protein meals to mitigate nausea. - Discuss potential side effects of GLP-1 receptor agonists, including nausea and muscle loss. - Patient denies personal or family history of multiple endocrine neoplasia type 2, medullary thyroid  cancer; personal history of pancreatitis or gallbladder disease.        Return for physical, fasting labs .    Subjective:    Chief Complaint  Patient presents with   Establish Care    HPI Discussed the use of AI scribe software for clinical note transcription with the patient, who gave verbal consent to proceed.  History of Present Illness Desiree Melendez is a 48 year old female who presents for establishing care and discussing weight management  options.  She has previously used semaglutide, a GLP-1 receptor agonist, which helped her lose approximately 30 pounds during menopause. Currently, she is not on any weight management medication and has gained back some weight, now weighing 166 pounds. She exercises regularly, including personal training sessions twice a week, Peloton once a week, and walking. Despite these efforts, she wishes to lose an additional 15 pounds to reach her goal weight in the 140s.  She believes she may be due for a Pap smear and reports attending annual mammograms. Her last Pap smear was in 2020, and it was normal with no HPV detected. She had a vulvar biopsy in 2022.  She has a family history of colon cancer; her maternal grandmother had colon cancer in her 53s during the 47s. There is no personal history of changes in stool, bleeding, or other gastrointestinal symptoms.  Her family history includes heart disease; her father passed away from a heart attack at 59, and her mother has high blood pressure. She has never smoked, attributing this to growing up in a household where her parents smoked, which deterred her from picking up the habit.  Socially, she is married and has 49 year old twins who are in college. 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 She enjoys the empty nest phase with her husband and maintains an active lifestyle.     Past Medical History:  Diagnosis Date   Infertility, female     Past Surgical History:  Procedure Laterality Date   CESAREAN SECTION      Family History  Problem Relation Age of Onset   Osteoarthritis Mother    Hypertension Mother    COPD Mother        smoker   Transient ischemic attack Mother    Asthma Father    Heart attack Father    COPD Father        smoker   Thyroid  disease Sister    Colon cancer Maternal Grandmother    Hypertension Maternal Grandmother    Stroke Maternal Grandmother    BRCA  1/2 Neg Hx    Breast cancer Neg Hx     Social History   Tobacco Use   Smoking status: Never   Smokeless tobacco: Never  Vaping Use   Vaping status: Never Used  Substance Use Topics   Alcohol use: No    Alcohol/week: 0.0 standard drinks of alcohol   Drug use: No     No Known Allergies  Review of Systems NEGATIVE UNLESS OTHERWISE INDICATED IN HPI      Objective:     BP 118/81   Pulse 71   Temp (!) 97.3 F (36.3 C) (Temporal)   Ht 5' 5 (1.651 m)   Wt 165 lb 6.4 oz (75 kg)   LMP 06/18/2018 (Approximate)   SpO2 100%   BMI 27.52 kg/m   Wt Readings from Last 3 Encounters:  11/01/23 165 lb 6.4 oz (75 kg)  02/16/21 184 lb (83.5 kg)  01/06/21 187 lb (84.8 kg)    BP Readings from Last 3 Encounters:  11/01/23 118/81  02/16/21 120/82  01/06/21 104/70     Physical Exam Vitals and nursing note reviewed.  Constitutional:      Appearance: Normal appearance. She is normal weight. She is not toxic-appearing.  HENT:     Head: Normocephalic and atraumatic.     Right Ear: External ear normal.     Left Ear: External ear normal.  Eyes:     Extraocular Movements: Extraocular movements intact.     Conjunctiva/sclera: Conjunctivae normal.     Pupils: Pupils are equal, round, and reactive to light.  Cardiovascular:     Rate and Rhythm: Normal rate and regular rhythm.     Pulses: Normal pulses.     Heart sounds: Normal heart sounds.  Pulmonary:     Effort: Pulmonary effort is normal.     Breath sounds: Normal breath sounds.  Musculoskeletal:        General: Normal range of motion.     Cervical back: Normal range of motion and neck supple.  Skin:    General: Skin is warm and dry.     Findings: No lesion or rash.  Neurological:     General: No focal deficit present.     Mental Status: She is alert and oriented to person, place, and time.  Psychiatric:        Mood and Affect: Mood normal.        Behavior: Behavior normal.             Yichen Gilardi M Grizel Vesely,  PA-C

## 2023-11-01 NOTE — Patient Instructions (Signed)
 Please call  GI to schedule your colonoscopy. [phone]   Welcome to [facility] at [facility] [address]! It was a pleasure meeting you today.  PLEASE NOTE:  If you had any LAB tests please let us  know if you have not heard back within a few days. You may see your results on MyChart before we have a chance to review them but we will give you a call once they are reviewed by us . If we ordered any REFERRALS today, please let us  know if you have not heard from their office within the next two weeks. Let us  know through MyChart if you are needing REFILLS, or have your pharmacy send us  the request. You can also use MyChart to communicate with me or any office staff.  Please try these tips to maintain a healthy lifestyle:  Eat most of your calories during the day when you are active. Eliminate processed foods including packaged sweets (pies, cakes, cookies), reduce intake of potatoes, white bread, white pasta, and white rice. Look for whole grain options, oat flour or almond flour.  Each meal should contain half fruits/vegetables, one quarter protein, and one quarter carbs (no bigger than a computer mouse).  Cut down on sweet beverages. This includes juice, soda, and sweet tea. Also watch fruit intake, though this is a healthier sweet option, it still contains natural sugar! Limit to 3 servings daily.  Drink at least 1 glass of water with each meal and aim for at least 8 glasses (64 ounces) per day.  Exercise at least 150 minutes every week to the best of your ability.    Take Care,  Annalaya Wile, PA-C

## 2023-11-21 ENCOUNTER — Other Ambulatory Visit: Payer: Self-pay | Admitting: Physician Assistant

## 2023-11-21 DIAGNOSIS — E663 Overweight: Secondary | ICD-10-CM

## 2023-12-17 ENCOUNTER — Other Ambulatory Visit: Payer: Self-pay | Admitting: Physician Assistant

## 2023-12-17 DIAGNOSIS — E663 Overweight: Secondary | ICD-10-CM

## 2023-12-19 ENCOUNTER — Encounter: Payer: Self-pay | Admitting: Physician Assistant

## 2023-12-19 ENCOUNTER — Ambulatory Visit (INDEPENDENT_AMBULATORY_CARE_PROVIDER_SITE_OTHER): Admitting: Physician Assistant

## 2023-12-19 ENCOUNTER — Other Ambulatory Visit (HOSPITAL_COMMUNITY)
Admission: RE | Admit: 2023-12-19 | Discharge: 2023-12-19 | Disposition: A | Discharge status: Home / Self Care | Source: Ambulatory Visit | Attending: Physician Assistant | Admitting: Physician Assistant

## 2023-12-19 VITALS — BP 110/72 | HR 82 | Temp 97.3°F | Ht 65.0 in | Wt 159.0 lb

## 2023-12-19 DIAGNOSIS — Z23 Encounter for immunization: Secondary | ICD-10-CM

## 2023-12-19 DIAGNOSIS — Z6826 Body mass index (BMI) 26.0-26.9, adult: Secondary | ICD-10-CM

## 2023-12-19 DIAGNOSIS — R6882 Decreased libido: Secondary | ICD-10-CM | POA: Insufficient documentation

## 2023-12-19 DIAGNOSIS — Z0000 Encounter for general adult medical examination without abnormal findings: Secondary | ICD-10-CM | POA: Diagnosis not present

## 2023-12-19 DIAGNOSIS — R5383 Other fatigue: Secondary | ICD-10-CM | POA: Insufficient documentation

## 2023-12-19 DIAGNOSIS — Z124 Encounter for screening for malignant neoplasm of cervix: Secondary | ICD-10-CM | POA: Insufficient documentation

## 2023-12-19 DIAGNOSIS — Z1159 Encounter for screening for other viral diseases: Secondary | ICD-10-CM

## 2023-12-19 DIAGNOSIS — Z72820 Sleep deprivation: Secondary | ICD-10-CM | POA: Insufficient documentation

## 2023-12-19 DIAGNOSIS — E559 Vitamin D deficiency, unspecified: Secondary | ICD-10-CM | POA: Insufficient documentation

## 2023-12-19 DIAGNOSIS — Z114 Encounter for screening for human immunodeficiency virus [HIV]: Secondary | ICD-10-CM

## 2023-12-19 DIAGNOSIS — E663 Overweight: Secondary | ICD-10-CM

## 2023-12-19 DIAGNOSIS — E66811 Obesity, class 1: Secondary | ICD-10-CM | POA: Insufficient documentation

## 2023-12-19 DIAGNOSIS — N951 Menopausal and female climacteric states: Secondary | ICD-10-CM | POA: Insufficient documentation

## 2023-12-19 LAB — CBC WITH DIFFERENTIAL/PLATELET
Basophils Absolute: 0 K/uL (ref 0.0–0.1)
Basophils Relative: 0.7 % (ref 0.0–3.0)
Eosinophils Absolute: 0.1 K/uL (ref 0.0–0.7)
Eosinophils Relative: 1.9 % (ref 0.0–5.0)
HCT: 42 % (ref 36.0–46.0)
Hemoglobin: 14 g/dL (ref 12.0–15.0)
Lymphocytes Relative: 38.8 % (ref 12.0–46.0)
Lymphs Abs: 2 K/uL (ref 0.7–4.0)
MCHC: 33.2 g/dL (ref 30.0–36.0)
MCV: 88.8 fl (ref 78.0–100.0)
Monocytes Absolute: 0.3 K/uL (ref 0.1–1.0)
Monocytes Relative: 5.6 % (ref 3.0–12.0)
Neutro Abs: 2.7 K/uL (ref 1.4–7.7)
Neutrophils Relative %: 53 % (ref 43.0–77.0)
Platelets: 380 K/uL (ref 150.0–400.0)
RBC: 4.73 Mil/uL (ref 3.87–5.11)
RDW: 12.8 % (ref 11.5–15.5)
WBC: 5.1 K/uL (ref 4.0–10.5)

## 2023-12-19 LAB — COMPREHENSIVE METABOLIC PANEL WITH GFR
ALT: 12 U/L (ref 0–35)
AST: 19 U/L (ref 0–37)
Albumin: 4.5 g/dL (ref 3.5–5.2)
Alkaline Phosphatase: 44 U/L (ref 39–117)
BUN: 12 mg/dL (ref 6–23)
CO2: 27 meq/L (ref 19–32)
Calcium: 9.3 mg/dL (ref 8.4–10.5)
Chloride: 104 meq/L (ref 96–112)
Creatinine, Ser: 1.04 mg/dL (ref 0.40–1.20)
GFR: 63.35 mL/min (ref >60.00)
Glucose, Bld: 85 mg/dL (ref 70–99)
Potassium: 3.8 meq/L (ref 3.5–5.1)
Sodium: 139 meq/L (ref 135–145)
Total Bilirubin: 0.4 mg/dL (ref 0.2–1.2)
Total Protein: 7.2 g/dL (ref 6.0–8.3)

## 2023-12-19 LAB — LIPID PANEL
Cholesterol: 173 mg/dL (ref 0–200)
HDL: 72.6 mg/dL (ref >39.00)
LDL Cholesterol: 89 mg/dL (ref 0–99)
NonHDL: 100.75
Total CHOL/HDL Ratio: 2
Triglycerides: 57 mg/dL (ref 0.0–149.0)
VLDL: 11.4 mg/dL (ref 0.0–40.0)

## 2023-12-19 LAB — TSH: TSH: 2.41 u[IU]/mL (ref 0.35–5.50)

## 2023-12-19 LAB — HEMOGLOBIN A1C: Hgb A1c MFr Bld: 5.4 % (ref 4.6–6.5)

## 2023-12-19 MED ORDER — TIRZEPATIDE-WEIGHT MANAGEMENT 5 MG/0.5ML ~~LOC~~ SOLN: 5.0000 mg | SUBCUTANEOUS | 2 refills | Status: AC

## 2023-12-19 NOTE — Patient Instructions (Addendum)
 Please call [phone] to make an appointment Colonoscopy  Keep up the good work!!

## 2023-12-19 NOTE — Progress Notes (Signed)
 Patient ID: Desiree Melendez, female    DOB: 26-Jan-1976, 48 y.o.   MRN: [id number]   Assessment & Plan:  Annual physical exam -     CBC with Differential/Platelet -     Comprehensive metabolic panel with GFR -     Hemoglobin A1c -     Lipid panel -     TSH  Screening for cervical cancer -     Cytology - PAP  Screening for HIV (human immunodeficiency virus)  Need for hepatitis C screening test  Immunization due -     Flu vaccine trivalent PF, 6mos and older(Flulaval,Afluria,Fluarix,Fluzone)  Overweight with body mass index (BMI) of 26 to 26.9 in adult -     Tirzepatide -Weight Management; Inject 5 mg into the skin every 7 (seven) days.  Dispense: 2 mL; Refill: 2      Assessment and Plan Assessment & Plan Well Woman Visit Routine annual well woman exam with no history of abnormal Pap smears or current gynecological issues. Monogamous relationship. Last Pap smear was five years ago. No periods for five years, likely due to early menopause. No significant menopausal symptoms. - Perform Pap smear with HPV co-testing - Order routine lab work  Obesity Obesity management with Zepbound . She reports no side effects and is considering increasing the dose. Current lifestyle includes regular exercise and protein intake. Weight loss progress noted, with a goal to reach the forties in weight. Medical decision making includes staying at the current dose if progress continues, with the option to increase after four weeks if a plateau occurs. - Increase Zepbound  to 5 mg once weekly - Encourage continued exercise and protein intake - Schedule follow-up in ten weeks to assess progress  Menopause Menopause with cessation of periods for five years. No significant menopausal symptoms reported. Family history of early menopause. No uterine bleeding since cessation of periods. - Monitor for any postmenopausal bleeding and report if occurs  General Health Maintenance Routine health maintenance  discussed. Colonoscopy and mammogram scheduled. Dermatology follow-up in place. Regular skin checks performed. - Ensure colonoscopy and mammogram appointments are scheduled - Continue annual dermatology skin checks   Age-appropriate screening and counseling performed today. Will check labs and call with results. Preventive measures discussed and printed in AVS for patient.   Patient Counseling: [x]   Nutrition: Stressed importance of moderation in sodium/caffeine intake, saturated fat and cholesterol, caloric balance, sufficient intake of fresh fruits, vegetables, and fiber.  [x]   Stressed the importance of regular exercise.   []   Substance Abuse: Discussed cessation/primary prevention of tobacco, alcohol, or other drug use; driving or other dangerous activities under the influence; availability of treatment for abuse.   []   Injury prevention: Discussed safety belts, safety helmets, smoke detector, smoking near bedding or upholstery.   []   Sexuality: Discussed sexually transmitted diseases, partner selection, use of condoms, avoidance of unintended pregnancy  and contraceptive alternatives.   [x]   Dental health: Discussed importance of regular tooth brushing, flossing, and dental visits.  [x]   Health maintenance and immunizations reviewed. Please refer to Health maintenance section.       Return in about 10 weeks (around 02/27/2024) for recheck/follow-up (Zepbound ) .    Subjective:    Chief Complaint  Patient presents with   Annual Exam    Pt in office for annual CPE and labs;     HPI Discussed the use of AI scribe software for clinical note transcription with the patient, who gave verbal consent to proceed.  History of  Present Illness Desiree Melendez is a 48 year old female who presents for her annual well woman exam.  She has been taking Zepbound  Files since August for weight management, experiencing weight loss without any side effects. She takes the medication once weekly  and maintains a routine of working out three to four days a week while keeping up with her protein intake.  She has not had a menstrual period in about five years, which began with irregularities around the age of 28. She was regular since age 59, then experienced a cessation of periods, with only two occurring six months apart before stopping completely. Her mother had a hysterectomy at a young age and experienced prolonged bleeding, but she did not have similar issues. She has not experienced any bleeding since then and reports no significant menopausal symptoms such as night sweats or hot flashes.  She is monogamous with her partner and has had no problems with Pap smears in the past. Her last Pap smear was five years ago, and she has not had any issues with periods or abnormal bleeding since then. She has scheduled a mammogram and is planning to schedule a colonoscopy. She has a dermatologist and attends regular skin checks.  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 She has a history of sun exposure due to her profession but uses sunscreen regularly.     Past Medical History:  Diagnosis Date   Infertility, female     Past Surgical History:  Procedure Laterality Date   CESAREAN SECTION      Family History  Problem Relation Age of Onset   Osteoarthritis Mother    Hypertension Mother    COPD Mother        smoker   Transient ischemic attack Mother    Asthma Father    Heart attack Father    COPD Father        smoker   Thyroid  disease Sister    Colon cancer Maternal Grandmother    Hypertension Maternal Grandmother    Stroke Maternal Grandmother    BRCA 1/2 Neg Hx    Breast cancer Neg Hx     Social History   Tobacco Use   Smoking status: Never   Smokeless tobacco: Never  Vaping Use   Vaping status: Never Used  Substance  Use Topics   Alcohol use: No    Alcohol/week: 0.0 standard drinks of alcohol   Drug use: No     No Known Allergies  Review of Systems NEGATIVE UNLESS OTHERWISE INDICATED IN HPI      Objective:     BP 110/72 (BP Location: Left Arm, Patient Position: Sitting, Cuff Size: Normal)   Pulse 82   Temp (!) 97.3 F (36.3 C) (Temporal)   Ht 5' 5 (1.651 m)   Wt 159 lb (72.1 kg)   LMP 06/18/2018 (Approximate)   SpO2 98%   BMI 26.46 kg/m   Wt Readings from Last 3 Encounters:  12/19/23 159 lb (72.1 kg)  11/01/23 165 lb 6.4 oz (75 kg)  02/16/21 184 lb (83.5 kg)    BP Readings from Last 3 Encounters:  12/19/23 110/72  11/01/23 118/81  02/16/21 120/82     Physical Exam Vitals and nursing note reviewed. Exam conducted with a chaperone present.  Constitutional:      Appearance: Normal appearance. She  is normal weight. She is not toxic-appearing.  HENT:     Head: Normocephalic and atraumatic.     Right Ear: Tympanic membrane, ear canal and external ear normal.     Left Ear: Tympanic membrane, ear canal and external ear normal.     Nose: Nose normal.     Mouth/Throat:     Mouth: Mucous membranes are moist.  Eyes:     Extraocular Movements: Extraocular movements intact.     Conjunctiva/sclera: Conjunctivae normal.     Pupils: Pupils are equal, round, and reactive to light.  Neck:     Thyroid : No thyroid  mass, thyromegaly or thyroid  tenderness.  Cardiovascular:     Rate and Rhythm: Normal rate and regular rhythm.     Pulses: Normal pulses.     Heart sounds: Normal heart sounds.  Pulmonary:     Effort: Pulmonary effort is normal.     Breath sounds: Normal breath sounds.  Chest:     Chest wall: No mass.  Breasts:    Right: Normal. No swelling, bleeding, inverted nipple, mass, nipple discharge, skin change or tenderness.     Left: Normal. No swelling, bleeding, inverted nipple, mass, nipple discharge, skin change or tenderness.  Abdominal:     General: Abdomen is flat.  Bowel sounds are normal.     Palpations: Abdomen is soft.  Genitourinary:    General: Normal vulva.     Labia:        Right: No rash, tenderness or lesion.        Left: No rash, tenderness or lesion.      Vagina: Normal.     Cervix: Normal.     Uterus: Normal.      Adnexa: Right adnexa normal and left adnexa normal.  Musculoskeletal:        General: Normal range of motion.     Cervical back: Normal range of motion and neck supple.     Right lower leg: No edema.     Left lower leg: No edema.  Lymphadenopathy:     Cervical: No cervical adenopathy.     Upper Body:     Right upper body: No supraclavicular, axillary or pectoral adenopathy.     Left upper body: No supraclavicular, axillary or pectoral adenopathy.  Skin:    General: Skin is warm and dry.     Findings: No lesion.  Neurological:     General: No focal deficit present.     Mental Status: She is alert and oriented to person, place, and time.  Psychiatric:        Mood and Affect: Mood normal.        Behavior: Behavior normal.        Thought Content: Thought content normal.        Judgment: Judgment normal.             Lucilia Yanni M Masashi Snowdon, PA-C

## 2023-12-20 ENCOUNTER — Ambulatory Visit: Payer: Self-pay | Admitting: Physician Assistant

## 2023-12-20 LAB — CYTOLOGY - PAP
Chlamydia: NEGATIVE
Comment: NEGATIVE
Comment: NEGATIVE
Comment: NEGATIVE
Comment: NORMAL
Diagnosis: NEGATIVE
High risk HPV: NEGATIVE
Neisseria Gonorrhea: NEGATIVE
Trichomonas: NEGATIVE

## 2024-01-14 ENCOUNTER — Encounter: Payer: Self-pay | Admitting: Physician Assistant

## 2024-01-14 DIAGNOSIS — Z1231 Encounter for screening mammogram for malignant neoplasm of breast: Secondary | ICD-10-CM

## 2024-01-20 ENCOUNTER — Other Ambulatory Visit: Payer: Self-pay | Admitting: Physician Assistant

## 2024-01-20 DIAGNOSIS — Z1231 Encounter for screening mammogram for malignant neoplasm of breast: Secondary | ICD-10-CM

## 2024-01-28 ENCOUNTER — Encounter

## 2024-01-28 DIAGNOSIS — Z1231 Encounter for screening mammogram for malignant neoplasm of breast: Secondary | ICD-10-CM

## 2024-02-03 ENCOUNTER — Other Ambulatory Visit: Payer: Self-pay | Admitting: Physician Assistant

## 2024-02-03 DIAGNOSIS — Z1231 Encounter for screening mammogram for malignant neoplasm of breast: Secondary | ICD-10-CM

## 2024-02-11 ENCOUNTER — Encounter

## 2024-02-11 DIAGNOSIS — Z1231 Encounter for screening mammogram for malignant neoplasm of breast: Secondary | ICD-10-CM

## 2024-02-18 ENCOUNTER — Ambulatory Visit

## 2024-02-18 DIAGNOSIS — Z1231 Encounter for screening mammogram for malignant neoplasm of breast: Secondary | ICD-10-CM

## 2024-02-28 ENCOUNTER — Ambulatory Visit: Admission: RE | Admit: 2024-02-28 | Discharge: 2024-02-28 | Disposition: A | Source: Ambulatory Visit

## 2024-02-28 DIAGNOSIS — Z1231 Encounter for screening mammogram for malignant neoplasm of breast: Secondary | ICD-10-CM

## 2024-03-06 ENCOUNTER — Encounter: Payer: Self-pay | Admitting: Physician Assistant

## 2024-03-06 ENCOUNTER — Ambulatory Visit: Admitting: Physician Assistant

## 2024-03-06 VITALS — BP 102/68 | HR 88 | Temp 97.5°F | Ht 65.0 in | Wt 155.0 lb

## 2024-03-06 DIAGNOSIS — Z1211 Encounter for screening for malignant neoplasm of colon: Secondary | ICD-10-CM

## 2024-03-06 DIAGNOSIS — Z6825 Body mass index (BMI) 25.0-25.9, adult: Secondary | ICD-10-CM | POA: Diagnosis not present

## 2024-03-06 DIAGNOSIS — E663 Overweight: Secondary | ICD-10-CM | POA: Diagnosis not present

## 2024-03-06 MED ORDER — TIRZEPATIDE-WEIGHT MANAGEMENT 7.5 MG/0.5ML ~~LOC~~ SOLN: 7.5000 mg | SUBCUTANEOUS | 2 refills | Status: AC

## 2024-03-06 NOTE — Patient Instructions (Addendum)
 Please call [facility] to schedule your colonoscopy. [phone]  Keep up the good work!

## 2024-03-06 NOTE — Progress Notes (Signed)
 "   Patient ID: Desiree Melendez, female    DOB: 06-02-1975, 48 y.o.   MRN: [id number]   Assessment & Plan:  Overweight with body mass index (BMI) of 25 to 25.9 in adult -     Tirzepatide -Weight Management; Inject 7.5 mg into the skin once a week.  Dispense: 2 mL; Refill: 2  Screening for colon cancer -     Ambulatory referral to Gastroenterology   Assessment & Plan Overweight She has been managing her weight with Zepbound  5 mg once weekly, resulting in a weight loss from 165 lbs in August to 155 lbs currently. Her BMI is 25.79, and her blood pressure is well-controlled. She reports feeling well, with no significant side effects such as constipation, diarrhea, or vision changes. She experiences reduced appetite and food cravings, which is a positive outcome of the medication. She is considering increasing the dose to enhance weight loss further, as she has lost 4 lbs in the last two months. The goal is to reach a weight between 145 and 150 lbs, which she feels would be ideal for her long-term health and comfort. - Continue monitoring weight and BMI. - Encouraged regular exercise, aiming for at least three days a week. - Discussed potential for rebound weight gain upon discontinuation of medication.      Return in about 3 months (around 06/04/2024) for recheck/follow-up.    Subjective:    Chief Complaint  Patient presents with   Medical Management of Chronic Issues    Pt in office to f/u with PCP on Zepbound  and discuss how treatment has been working for her; pt doesn't think she is having any side effects at all; no concerns with treatment at this point    HPI Discussed the use of AI scribe software for clinical note transcription with the patient, who gave verbal consent to proceed.  History of Present Illness Desiree Melendez is a 48 year old female who presents for follow-up on weight loss management.  She is currently on Zepbound  5 mg once weekly, obtained through  self-pay. Her initial weight in August was 165 pounds, and she now weighs 155 pounds. She feels well on the medication and tolerates it without significant side effects.  Her eating habits have changed, with a reduction in 'food noise' and less frequent thoughts about food. She typically snacks lightly on items like hard-boiled eggs and nuts and consumes a well-balanced dinner. This week, she noticed an increase in appetite after her injection, leading her to eat two meals in one day, which is atypical for her.  She engages in physical activity three to four times a week, though this month it has been more consistently three times a week. She is comfortable with her current weight and clothing and aims for a target weight of 145 pounds.  No significant side effects from the medication, such as constipation, diarrhea, severe bloating, or vision changes. She is satisfied with her progress.  She has encountered difficulties in scheduling a colonoscopy due to communication issues with the gastroenterology office, having made multiple unsuccessful attempts to contact them, resulting in the closure of her referral without proper notification.     Past Medical History:  Diagnosis Date   Infertility, female     Past Surgical History:  Procedure Laterality Date   CESAREAN SECTION      Family History  Problem Relation Age of Onset   Osteoarthritis Mother    Hypertension Mother    COPD Mother  smoker   Transient ischemic attack Mother    Asthma Father    Heart attack Father    COPD Father        smoker   Thyroid  disease Sister    Colon cancer Maternal Grandmother    Hypertension Maternal Grandmother    Stroke Maternal Grandmother    BRCA 1/2 Neg Hx    Breast cancer Neg Hx     Social History[1]   Allergies[2]  Review of Systems NEGATIVE UNLESS OTHERWISE INDICATED IN HPI      Objective:     BP 102/68 (BP Location: Left Arm, Patient Position: Sitting, Cuff Size: Normal)    Pulse 88   Temp (!) 97.5 F (36.4 C) (Temporal)   Ht 5' 5 (1.651 m)   Wt 155 lb (70.3 kg)   LMP 06/18/2018   SpO2 99%   BMI 25.79 kg/m   Wt Readings from Last 3 Encounters:  03/06/24 155 lb (70.3 kg)  12/19/23 159 lb (72.1 kg)  11/01/23 165 lb 6.4 oz (75 kg)    BP Readings from Last 3 Encounters:  03/06/24 102/68  12/19/23 110/72  11/01/23 118/81     Physical Exam Vitals and nursing note reviewed.  Constitutional:      Appearance: Normal appearance. She is normal weight. She is not toxic-appearing.  HENT:     Head: Normocephalic and atraumatic.     Right Ear: External ear normal.     Left Ear: External ear normal.  Eyes:     Extraocular Movements: Extraocular movements intact.     Conjunctiva/sclera: Conjunctivae normal.     Pupils: Pupils are equal, round, and reactive to light.  Cardiovascular:     Rate and Rhythm: Normal rate and regular rhythm.  Pulmonary:     Effort: Pulmonary effort is normal.  Musculoskeletal:        General: Normal range of motion.     Cervical back: Normal range of motion and neck supple.  Skin:    General: Skin is warm and dry.  Neurological:     General: No focal deficit present.     Mental Status: She is alert and oriented to person, place, and time.  Psychiatric:        Mood and Affect: Mood normal.        Behavior: Behavior normal.             Shivaay Stormont M Dontea Corlew, PA-C     [1]  Social History Tobacco Use   Smoking status: Never   Smokeless tobacco: Never  Vaping Use   Vaping status: Never Used  Substance Use Topics   Alcohol use: No    Alcohol/week: 0.0 standard drinks of alcohol   Drug use: No  [2] No Known Allergies  "

## 2024-04-23 ENCOUNTER — Encounter: Payer: Self-pay | Admitting: Gastroenterology

## 2024-06-05 ENCOUNTER — Ambulatory Visit: Admitting: Physician Assistant
